# Patient Record
Sex: Male | Born: 2000 | Hispanic: No | Marital: Single | Smoking: Current every day smoker
Health system: Southern US, Community
[De-identification: ages and names within clinical notes are randomized; demographics above are authoritative.]

## PROBLEM LIST (undated history)

## (undated) DIAGNOSIS — R569 Unspecified convulsions: Secondary | ICD-10-CM

## (undated) DIAGNOSIS — N179 Acute kidney failure, unspecified: Secondary | ICD-10-CM

## (undated) DIAGNOSIS — S069X9A Unspecified intracranial injury with loss of consciousness of unspecified duration, initial encounter: Secondary | ICD-10-CM

## (undated) DIAGNOSIS — S069XAA Unspecified intracranial injury with loss of consciousness status unknown, initial encounter: Secondary | ICD-10-CM

## (undated) DIAGNOSIS — F449 Dissociative and conversion disorder, unspecified: Secondary | ICD-10-CM

## (undated) DIAGNOSIS — K5090 Crohn's disease, unspecified, without complications: Secondary | ICD-10-CM

## (undated) DIAGNOSIS — F4310 Post-traumatic stress disorder, unspecified: Secondary | ICD-10-CM

## (undated) HISTORY — DX: Crohn's disease, unspecified, without complications: K50.90

## (undated) HISTORY — DX: Acute kidney failure, unspecified: N17.9

## (undated) HISTORY — DX: Unspecified intracranial injury with loss of consciousness status unknown, initial encounter: S06.9XAA

## (undated) HISTORY — PX: COLONOSCOPY: SHX174

## (undated) HISTORY — DX: Dissociative and conversion disorder, unspecified: F44.9

## (undated) HISTORY — DX: Post-traumatic stress disorder, unspecified: F43.10

## (undated) HISTORY — PX: UPPER GI ENDOSCOPY: SHX6162

## (undated) HISTORY — DX: Unspecified intracranial injury with loss of consciousness of unspecified duration, initial encounter: S06.9X9A

## (undated) HISTORY — PX: TONSILECTOMY, ADENOIDECTOMY, BILATERAL MYRINGOTOMY AND TUBES: SHX2538

---
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
## 2020-04-23 DIAGNOSIS — S069X9A Unspecified intracranial injury with loss of consciousness of unspecified duration, initial encounter: Secondary | ICD-10-CM | POA: Insufficient documentation

## 2020-04-23 DIAGNOSIS — K50119 Crohn's disease of large intestine with unspecified complications: Secondary | ICD-10-CM | POA: Insufficient documentation

## 2020-04-23 DIAGNOSIS — F4312 Post-traumatic stress disorder, chronic: Secondary | ICD-10-CM | POA: Insufficient documentation

## 2020-04-23 DIAGNOSIS — Z87898 Personal history of other specified conditions: Secondary | ICD-10-CM | POA: Insufficient documentation

## 2020-07-21 ENCOUNTER — Ambulatory Visit: Payer: BC Managed Care – PPO | Admitting: Internal Medicine

## 2020-07-25 ENCOUNTER — Other Ambulatory Visit: Payer: Self-pay

## 2020-07-25 ENCOUNTER — Emergency Department (HOSPITAL_COMMUNITY)
Admission: EM | Admit: 2020-07-25 | Discharge: 2020-07-26 | Disposition: A | Discharge status: Home / Self Care | Payer: BC Managed Care – PPO | Attending: Emergency Medicine | Admitting: Emergency Medicine

## 2020-07-25 ENCOUNTER — Encounter (HOSPITAL_COMMUNITY): Payer: Self-pay | Admitting: Emergency Medicine

## 2020-07-25 DIAGNOSIS — M5441 Lumbago with sciatica, right side: Secondary | ICD-10-CM | POA: Insufficient documentation

## 2020-07-25 DIAGNOSIS — M545 Low back pain, unspecified: Secondary | ICD-10-CM | POA: Diagnosis present

## 2020-07-25 DIAGNOSIS — M5431 Sciatica, right side: Secondary | ICD-10-CM

## 2020-07-25 DIAGNOSIS — M5442 Lumbago with sciatica, left side: Secondary | ICD-10-CM | POA: Diagnosis not present

## 2020-07-25 DIAGNOSIS — M5432 Sciatica, left side: Secondary | ICD-10-CM

## 2020-07-25 HISTORY — DX: Unspecified convulsions: R56.9

## 2020-07-25 LAB — URINALYSIS, ROUTINE W REFLEX MICROSCOPIC
Bilirubin Urine: NEGATIVE
Glucose, UA: NEGATIVE mg/dL
Hgb urine dipstick: NEGATIVE
Ketones, ur: NEGATIVE mg/dL
Leukocytes,Ua: NEGATIVE
Nitrite: NEGATIVE
Protein, ur: NEGATIVE mg/dL
Specific Gravity, Urine: 1.027 (ref 1.005–1.030)
pH: 6 (ref 5.0–8.0)

## 2020-07-25 MED ORDER — KETOROLAC TROMETHAMINE 30 MG/ML IJ SOLN
30.0000 mg | Freq: Once | INTRAMUSCULAR | Status: AC
  Administered 2020-07-26: 30 mg via INTRAMUSCULAR
  Filled 2020-07-25: qty 1

## 2020-07-25 NOTE — ED Triage Notes (Signed)
Pt c/o left sided lower back pain that moves down his leg.  Also has some pain on the right but not as much.  Hx of seizure d/o

## 2020-07-26 ENCOUNTER — Emergency Department (HOSPITAL_COMMUNITY): Payer: BC Managed Care – PPO

## 2020-07-26 MED ORDER — NAPROXEN 500 MG PO TABS: 500.0000 mg | ORAL_TABLET | Freq: Two times a day (BID) | ORAL | 0 refills | Status: DC

## 2020-07-26 MED ORDER — CYCLOBENZAPRINE HCL 10 MG PO TABS
5.0000 mg | ORAL_TABLET | Freq: Once | ORAL | Status: AC
  Administered 2020-07-26: 5 mg via ORAL
  Filled 2020-07-26: qty 1

## 2020-07-26 NOTE — Discharge Instructions (Addendum)
You are seen today for back pain.  Some of your symptoms are consistent with sciatica.  This is usually inflammation of the disc in your spine.  Take anti-inflammatories for pain.  You may benefit from PT.

## 2020-07-26 NOTE — ED Provider Notes (Signed)
[facility] EMERGENCY DEPARTMENT Provider Note   CSN: [id number] Arrival date & time: 07/25/20  2108     History Chief Complaint  Patient presents with  . Back Pain    Gregg Gibson is a 20 y.o. male.  HPI     This a 21 year old male with a history of seizures who presents with back pain.  Patient reports 2-day history of lower back pain.  He states that it radiates into the bilateral legs.  It radiates down to the posterior knee on the right down to his foot on the left.  He reports pain that inhibits his ability to walk.  He is not had any weakness or difficulty peeing.  He has not taken anything for the pain but states that he smokes marijuana frequently.  He rates his pain at 10 out of 10.  He describes the pain is achy and "tingly."  He denies any falls or injury.  Denies heavy lifting.  Denies history of cancer or IV drug use.  Past Medical History:  Diagnosis Date  . Seizures ([place])     There are no problems to display for this patient.   History reviewed. No pertinent surgical history.     No family history on file.     Home Medications Prior to Admission medications   Medication Sig Start Date End Date Taking? Authorizing Provider  naproxen (NAPROSYN) 500 MG tablet Take 1 tablet (500 mg total) by mouth 2 (two) times daily. 07/26/20  Yes Elsi Stelzer, Barbette Hair, MD    Allergies    Patient has no allergy information on record.  Review of Systems   Review of Systems  Genitourinary: Negative for difficulty urinating and dysuria.  Musculoskeletal: Positive for back pain.  Neurological: Negative for weakness.  All other systems reviewed and are negative.   Physical Exam Updated Vital Signs BP 120/83 (BP Location: Left Arm)   Pulse 72   Temp 98.2 F (36.8 C) (Oral)   Resp 16   SpO2 98%   Physical Exam Vitals and nursing note reviewed.  Constitutional:      Appearance: He is well-developed and well-nourished. He is not ill-appearing.   HENT:     Head: Normocephalic and atraumatic.     Nose: Nose normal.     Mouth/Throat:     Mouth: Mucous membranes are moist.  Eyes:     Pupils: Pupils are equal, round, and reactive to light.  Cardiovascular:     Rate and Rhythm: Normal rate and regular rhythm.     Heart sounds: Normal heart sounds. No murmur heard.   Pulmonary:     Effort: Pulmonary effort is normal. No respiratory distress.     Breath sounds: Normal breath sounds. No wheezing.  Abdominal:     General: Bowel sounds are normal.     Palpations: Abdomen is soft.     Tenderness: There is no abdominal tenderness. There is no rebound.  Musculoskeletal:        General: No edema.     Cervical back: Neck supple.     Comments: Tenderness palpation bilateral paraspinous musculature of the lower lumbar spine, no midline tenderness palpation, step-off, deformity  Lymphadenopathy:     Cervical: No cervical adenopathy.  Skin:    General: Skin is warm and dry.  Neurological:     Mental Status: He is alert and oriented to person, place, and time.     Comments: 5 out of 5 strength bilateral lower extremities, 2+ and  equal patellar reflexes bilaterally  Psychiatric:        Mood and Affect: Mood and affect and mood normal.     ED Results / Procedures / Treatments   Labs (all labs ordered are listed, but only abnormal results are displayed) Labs Reviewed  URINALYSIS, ROUTINE W REFLEX MICROSCOPIC    EKG None  Radiology DG Lumbar Spine Complete  Result Date: 07/26/2020 CLINICAL DATA:  Back pain EXAM: LUMBAR SPINE - COMPLETE 4+ VIEW COMPARISON:  None. FINDINGS: There is no evidence of lumbar spine fracture. Alignment is normal. Intervertebral disc spaces are maintained. IMPRESSION: Negative. Electronically Signed   By: Fidela Salisbury MD   On: 07/26/2020 01:11    Procedures Procedures   Medications Ordered in ED Medications  ketorolac (TORADOL) 30 MG/ML injection 30 mg (30 mg Intramuscular Given 07/26/20 0020)   cyclobenzaprine (FLEXERIL) tablet 5 mg (5 mg Oral Given 07/26/20 0119)    ED Course  I have reviewed the triage vital signs and the nursing notes.  Pertinent labs & imaging results that were available during my care of the patient were reviewed by me and considered in my medical decision making (see chart for details).  Clinical Course as of 07/26/20 0144  Tue Jul 26, 2020  0038 Patient continuing to complain of pain following Toradol.  He reports no improvement.  Added muscle relaxant and will obtain x-rays. [CH]    Clinical Course User Index [CH] Anothy Bufano, Barbette Hair, MD   MDM Rules/Calculators/A&P                          Patient presents with low back pain.  Overall nontoxic and vital signs are reassuring.  No signs or symptoms of cauda equina.  Given radiation of pain, symptoms seem consistent with sciatica.  He has no red flags and his neurologic exam is normal.  He was initially given Toradol with no improvement.  X-ray showed no evidence of acute fracture.  Muscle relaxant was added.  He is ambulatory.  Recommend scheduled anti-inflammatories and physical therapy for presumed sciatica.  He is afebrile.  Low suspicion for spinal lesion such as epidural abscess.  After history, exam, and medical workup I feel the patient has been appropriately medically screened and is safe for discharge home. Pertinent diagnoses were discussed with the patient. Patient was given return precautions.  Final Clinical Impression(s) / ED Diagnoses Final diagnoses:  Bilateral sciatica    Rx / DC Orders ED Discharge Orders         Ordered    naproxen (NAPROSYN) 500 MG tablet  2 times daily        07/26/20 0144           Greig Altergott, Barbette Hair, MD 07/26/20 0145

## 2020-07-26 NOTE — ED Notes (Signed)
Pt taken to scan at this time.

## 2020-07-28 ENCOUNTER — Ambulatory Visit (INDEPENDENT_AMBULATORY_CARE_PROVIDER_SITE_OTHER): Payer: BC Managed Care – PPO | Admitting: Nurse Practitioner

## 2020-07-28 ENCOUNTER — Telehealth (INDEPENDENT_AMBULATORY_CARE_PROVIDER_SITE_OTHER): Payer: Self-pay | Admitting: Nurse Practitioner

## 2020-07-28 ENCOUNTER — Other Ambulatory Visit: Payer: Self-pay

## 2020-07-28 ENCOUNTER — Encounter (INDEPENDENT_AMBULATORY_CARE_PROVIDER_SITE_OTHER): Payer: Self-pay | Admitting: Nurse Practitioner

## 2020-07-28 VITALS — BP 130/80 | HR 92 | Temp 97.8°F | Resp 18 | Ht 68.0 in | Wt 144.0 lb

## 2020-07-28 DIAGNOSIS — Z131 Encounter for screening for diabetes mellitus: Secondary | ICD-10-CM

## 2020-07-28 DIAGNOSIS — M5441 Lumbago with sciatica, right side: Secondary | ICD-10-CM

## 2020-07-28 DIAGNOSIS — F4310 Post-traumatic stress disorder, unspecified: Secondary | ICD-10-CM | POA: Diagnosis not present

## 2020-07-28 DIAGNOSIS — R569 Unspecified convulsions: Secondary | ICD-10-CM | POA: Diagnosis not present

## 2020-07-28 DIAGNOSIS — G8929 Other chronic pain: Secondary | ICD-10-CM

## 2020-07-28 DIAGNOSIS — R002 Palpitations: Secondary | ICD-10-CM

## 2020-07-28 DIAGNOSIS — M5442 Lumbago with sciatica, left side: Secondary | ICD-10-CM | POA: Diagnosis not present

## 2020-07-28 DIAGNOSIS — R000 Tachycardia, unspecified: Secondary | ICD-10-CM | POA: Diagnosis not present

## 2020-07-28 DIAGNOSIS — Z8269 Family history of other diseases of the musculoskeletal system and connective tissue: Secondary | ICD-10-CM

## 2020-07-28 DIAGNOSIS — Z13220 Encounter for screening for lipoid disorders: Secondary | ICD-10-CM

## 2020-07-28 DIAGNOSIS — Z1329 Encounter for screening for other suspected endocrine disorder: Secondary | ICD-10-CM

## 2020-07-28 NOTE — Progress Notes (Signed)
Subjective:  Patient ID: Gregg Gibson, male    DOB: 2001-03-02  Age: 20 y.o. MRN: [id number]  CC:  Chief Complaint  Patient presents with  . Establish Care  . Back Pain  . 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  He recently moved to this area from our area. He is here to establish care with a new provider. His main concerns are listed above. Back pain: He has been experiencing back pain for approximately 4 years. It is intermittent and usually is located more towards his left side and will radiate down his left leg. About a week ago he started experiencing severe 10 out of 10 achy/tingly pain in his lower back with bilateral radiculopathy. At that time he went to the emergency department and work-up included x-ray which did not show any significant abnormalities of his lower back. He was given muscle relaxers which did improve his pain and was told to take naproxen as needed at home. I did recommend that patient follow-up with physical therapy but he does not recall being referred to physical therapy by the emergency department.  Seizures: He has a history of seizures and tells me that his first seizure occurred when he was 20 years of age. He tells me his last seizure was a few weeks ago. He continues on Lamictal for treatment of his seizures and he also has an service dog that he keeps with him for both seizures and for his PTSD. He is in need of establishing care with a neurologist.  Cardiac palpitations: He reports having cardiac palpitations that occur every time he bends over and stands back up. He tells me he feels like a fluttering in his chest and his apple watch will tell him that his heart rate is going up to the 180s. This is very concerning to him. He denies any significant chest pain but does have some shortness of breath during the episodes.  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 He is very concerned that he too may have this would like to be evaluated by rheumatology and endocrinology.  PTSD: He does report history of PTSD and is currently on Zoloft. He would like to be evaluated and managed by psychiatry for this.  Past Medical History:  Diagnosis Date  . PTSD (post-traumatic stress disorder)   . Seizures ([place])   . TBI (traumatic brain injury) ([place])       Family History  Problem Relation Age of Onset  . Other Sister        EDS    Social History   Social History Narrative  . Not on file   Social History   Tobacco Use  . Smoking status: Not on file  . Smokeless tobacco: Not on file  Substance Use Topics  . Alcohol use: Not on file     Current Meds  Medication Sig  . carvedilol (COREG) 25 MG tablet Take 1 tablet by mouth in the morning and at bedtime.  . dicyclomine (BENTYL) 20 MG tablet Take 20 mg by mouth 3 (three) times daily.  . hydrOXYzine (ATARAX/VISTARIL) 25 MG tablet Take by mouth.  . lamoTRIgine (LAMICTAL) 200 MG tablet Take 1 tablet by mouth in the morning and at bedtime. Extended release.  . lamoTRIgine (LAMICTAL) 200 MG tablet SMARTSIG:1 Tablet(s) By  Mouth Morning-Night  . mesalamine (APRISO) 0.375 g 24 hr capsule Take 1.5 g by mouth every morning.  . naproxen (NAPROSYN) 500 MG tablet Take 1 tablet (500 mg total) by mouth 2 (two) times daily.  Marland Kitchen omeprazole (PRILOSEC) 20 MG capsule Take 40 mg by mouth 2 (two) times daily.  . ondansetron (ZOFRAN) 4 MG tablet Take 4 mg by mouth 3 (three) times daily as needed.  . prazosin (MINIPRESS) 2 MG capsule Take 2 mg by mouth 2 (two) times daily.  . sertraline (ZOLOFT) 100 MG tablet Take 1 tablet by mouth in the morning and at bedtime.    ROS:  Review of Systems  Constitutional: Negative for fever.  Eyes: Positive for blurred vision (and nystagmus with seizures).  Respiratory: Positive for shortness  of breath.   Cardiovascular: Positive for palpitations. Negative for chest pain.  Neurological: Positive for dizziness, seizures and headaches (mostly on right side).     Objective:   Today's Vitals: BP 130/80 (BP Location: Right Arm, Patient Position: Sitting, Cuff Size: Normal)   Pulse 92   Temp 97.8 F (36.6 C) (Temporal)   Resp 18   Ht _0  (1.727 m)   Wt 144 lb (65.3 kg)   SpO2 100%   BMI 21.90 kg/m  Vitals with BMI 07/28/2020 07/26/2020 07/26/2020  Height _1  - -  Weight 144 lbs - -  BMI 37.8 - -  Systolic 588 502 774  Diastolic 80 74 83  Pulse 92 66 72     Physical Exam Vitals reviewed.  Constitutional:      Appearance: Normal appearance.  HENT:     Head: Normocephalic and atraumatic.  Cardiovascular:     Rate and Rhythm: Normal rate and regular rhythm.  Pulmonary:     Effort: Pulmonary effort is normal.     Breath sounds: Normal breath sounds.  Musculoskeletal:     Cervical back: Normal and neck supple.     Thoracic back: Normal.     Lumbar back: No bony tenderness. Positive left straight leg raise test.  Skin:    General: Skin is warm and dry.  Neurological:     Mental Status: He is alert and oriented to person, place, and time.     Sensory: Sensation is intact.     Motor: Weakness (slightly weaker to left lower extremity) present.     Gait: Gait is intact.  Psychiatric:        Mood and Affect: Mood normal.        Behavior: Behavior normal.        Thought Content: Thought content normal.        Judgment: Judgment normal.          Assessment and Plan   1. Chronic bilateral low back pain with bilateral sciatica   2. Seizures ([place])   3. PTSD (post-traumatic stress disorder)   4. Tachycardia   5. Palpitations   6. Family history of connective tissue disease   7. Thyroid disorder screen   8. Screening for diabetes mellitus   9. Screening cholesterol level      Plan: 1. I do agree with the ER physician and believe that his symptoms most  likely represent sciatica and muscle spasm. Muscle spasm seems to be much improved compared to last week. I will refer him to physical medicine rehab for further assistance with managing his back pain. 2. We will refer him to neurology to help with managing his seizures. 3. We will refer him  to psychiatry to help manage his PTSD. 4, 5. Will refer patient to cardiology for further evaluation of his palpitations. 6. 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 This is a syndrome that I am not familiar with and will look into between now and his next appointment. 7.-9. We will check blood work for further evaluation today.   Tests ordered Orders Placed This Encounter  Procedures  . CBC with Differential/Platelets  . CMP with eGFR([facility])  . Lipid Panel  . Hemoglobin A1c  . TSH  . Ambulatory referral to Physical Medicine Rehab  . Ambulatory referral to Neurology  . Ambulatory referral to Psychiatry  . Ambulatory referral to Cardiology  . Ambulatory referral to Rheumatology  . Ambulatory referral to Endocrinology      No orders of the defined types were placed in this encounter.   Patient to follow-up in 1 month or sooner as needed.  Ailene Ards, NP

## 2020-07-28 NOTE — Telephone Encounter (Addendum)
Ordering referral to neurology I prefer [facility]. Thank you.  He is also being referred for the following: 1. Cardiology for cardiac palpitations 2. Endocrinology - Evaluation of possible Ehlers Danlos Syndrome 3. Rheumatology - Evaluation of possible Ehlers Danlos Sydrome 4. Psychiatry - Management of his PTSD 5. Physical Medicine Rehab - Back pain

## 2020-07-29 ENCOUNTER — Telehealth: Payer: Self-pay

## 2020-07-29 ENCOUNTER — Encounter (INDEPENDENT_AMBULATORY_CARE_PROVIDER_SITE_OTHER): Payer: Self-pay | Admitting: Nurse Practitioner

## 2020-07-29 LAB — CBC WITH DIFFERENTIAL/PLATELET
Absolute Monocytes: 529 cells/uL (ref 200–950)
Basophils Absolute: 19 cells/uL (ref 0–200)
Basophils Relative: 0.3 %
Eosinophils Absolute: 296 cells/uL (ref 15–500)
Eosinophils Relative: 4.7 %
HCT: 44.9 % (ref 38.5–50.0)
Hemoglobin: 15.3 g/dL (ref 13.2–17.1)
Lymphs Abs: 2079 cells/uL (ref 850–3900)
MCH: 30.5 pg (ref 27.0–33.0)
MCHC: 34.1 g/dL (ref 32.0–36.0)
MCV: 89.4 fL (ref 80.0–100.0)
MPV: 12.6 fL — ABNORMAL HIGH (ref 7.5–12.5)
Monocytes Relative: 8.4 %
Neutro Abs: 3377 cells/uL (ref 1500–7800)
Neutrophils Relative %: 53.6 %
Platelets: 220 10*3/uL (ref 140–400)
RBC: 5.02 10*6/uL (ref 4.20–5.80)
RDW: 12 % (ref 11.0–15.0)
Total Lymphocyte: 33 %
WBC: 6.3 10*3/uL (ref 3.8–10.8)

## 2020-07-29 LAB — COMPLETE METABOLIC PANEL WITH GFR
AG Ratio: 2 (calc) (ref 1.0–2.5)
ALT: 14 U/L (ref 9–46)
AST: 18 U/L (ref 10–40)
Albumin: 4.9 g/dL (ref 3.6–5.1)
Alkaline phosphatase (APISO): 77 U/L (ref 36–130)
BUN: 14 mg/dL (ref 7–25)
CO2: 29 mmol/L (ref 20–32)
Calcium: 10.3 mg/dL (ref 8.6–10.3)
Chloride: 103 mmol/L (ref 98–110)
Creat: 0.89 mg/dL (ref 0.60–1.35)
GFR, Est African American: 143 mL/min/{1.73_m2} (ref >=60)
GFR, Est Non African American: 123 mL/min/{1.73_m2} (ref >=60)
Globulin: 2.5 g/dL (calc) (ref 1.9–3.7)
Glucose, Bld: 71 mg/dL (ref 65–139)
Potassium: 4.9 mmol/L (ref 3.5–5.3)
Sodium: 141 mmol/L (ref 135–146)
Total Bilirubin: 0.3 mg/dL (ref 0.2–1.2)
Total Protein: 7.4 g/dL (ref 6.1–8.1)

## 2020-07-29 LAB — HEMOGLOBIN A1C
Hgb A1c MFr Bld: 5.2 % of total Hgb (ref <5.7)
Mean Plasma Glucose: 103 mg/dL
eAG (mmol/L): 5.7 mmol/L

## 2020-07-29 LAB — LIPID PANEL
Cholesterol: 212 mg/dL — ABNORMAL HIGH (ref <200)
HDL: 33 mg/dL — ABNORMAL LOW (ref >=40)
LDL Cholesterol (Calc): 160 mg/dL (calc) — ABNORMAL HIGH
Non-HDL Cholesterol (Calc): 179 mg/dL (calc) — ABNORMAL HIGH (ref <130)
Total CHOL/HDL Ratio: 6.4 (calc) — ABNORMAL HIGH (ref <5.0)
Triglycerides: 85 mg/dL (ref <150)

## 2020-07-29 LAB — TSH: TSH: 3.48 mIU/L (ref 0.40–4.50)

## 2020-07-29 NOTE — Telephone Encounter (Signed)
Hi Sarah- I spoke with Dr Dorris Fetch about this referral we received via fax. This patient needs to be referred to Rheumatology  For [facility] Danos Syndrome. Thank you!

## 2020-07-30 NOTE — Telephone Encounter (Signed)
Thank you [place]!

## 2020-08-01 NOTE — Telephone Encounter (Signed)
You are welcome

## 2020-08-01 NOTE — Telephone Encounter (Signed)
I sent over all referrals. Waiting for appt to be set soon. TBA

## 2020-08-02 ENCOUNTER — Encounter: Payer: Self-pay | Admitting: Physical Medicine and Rehabilitation

## 2020-08-15 ENCOUNTER — Telehealth (INDEPENDENT_AMBULATORY_CARE_PROVIDER_SITE_OTHER): Payer: Self-pay

## 2020-08-15 DIAGNOSIS — F4310 Post-traumatic stress disorder, unspecified: Secondary | ICD-10-CM

## 2020-08-15 DIAGNOSIS — M5442 Lumbago with sciatica, left side: Secondary | ICD-10-CM

## 2020-08-15 DIAGNOSIS — G8929 Other chronic pain: Secondary | ICD-10-CM

## 2020-08-15 MED ORDER — NAPROXEN 500 MG PO TABS: 500.0000 mg | ORAL_TABLET | Freq: Two times a day (BID) | ORAL | 0 refills | Status: DC

## 2020-08-15 MED ORDER — HYDROXYZINE HCL 25 MG PO TABS: 25.0000 mg | ORAL_TABLET | Freq: Every day | ORAL | 1 refills | Status: DC | PRN

## 2020-08-15 NOTE — Telephone Encounter (Signed)
Gregg Gibson, can you take care of this since you saw the patient?  Thanks.

## 2020-08-15 NOTE — Telephone Encounter (Signed)
Patient called and left a detailed voice message requesting a refill for the following medications:  hydrOXYzine (ATARAX/VISTARIL) 25 MG tablet  Last filled 04/07/2020, not able to see the quantity  naproxen (NAPROSYN) 500 MG tablet  Last filled on 07/26/2020, # 30 BID with 0 refills  Last OV 07/28/2020  Next OIV 08/31/2020  [place]

## 2020-08-20 ENCOUNTER — Emergency Department (HOSPITAL_COMMUNITY)
Admission: EM | Admit: 2020-08-20 | Discharge: 2020-08-20 | Disposition: A | Discharge status: Home / Self Care | Payer: BC Managed Care – PPO | Attending: Emergency Medicine | Admitting: Emergency Medicine

## 2020-08-20 ENCOUNTER — Other Ambulatory Visit: Payer: Self-pay

## 2020-08-20 ENCOUNTER — Emergency Department (HOSPITAL_COMMUNITY): Payer: BC Managed Care – PPO

## 2020-08-20 ENCOUNTER — Encounter (HOSPITAL_COMMUNITY): Payer: Self-pay | Admitting: Emergency Medicine

## 2020-08-20 ENCOUNTER — Encounter (INDEPENDENT_AMBULATORY_CARE_PROVIDER_SITE_OTHER): Payer: Self-pay | Admitting: Nurse Practitioner

## 2020-08-20 DIAGNOSIS — R7989 Other specified abnormal findings of blood chemistry: Secondary | ICD-10-CM

## 2020-08-20 DIAGNOSIS — R7402 Elevation of levels of lactic acid dehydrogenase (LDH): Secondary | ICD-10-CM | POA: Insufficient documentation

## 2020-08-20 DIAGNOSIS — R4182 Altered mental status, unspecified: Secondary | ICD-10-CM | POA: Insufficient documentation

## 2020-08-20 LAB — COMPREHENSIVE METABOLIC PANEL
ALT: 15 U/L (ref 0–44)
AST: 21 U/L (ref 15–41)
Albumin: 4.6 g/dL (ref 3.5–5.0)
Alkaline Phosphatase: 63 U/L (ref 38–126)
Anion gap: 12 (ref 5–15)
BUN: 17 mg/dL (ref 6–20)
CO2: 25 mmol/L (ref 22–32)
Calcium: 9.7 mg/dL (ref 8.9–10.3)
Chloride: 102 mmol/L (ref 98–111)
Creatinine, Ser: 0.97 mg/dL (ref 0.61–1.24)
GFR, Estimated: >60 mL/min (ref >60)
Glucose, Bld: 135 mg/dL — ABNORMAL HIGH (ref 70–99)
Potassium: 4 mmol/L (ref 3.5–5.1)
Sodium: 139 mmol/L (ref 135–145)
Total Bilirubin: 0.9 mg/dL (ref 0.3–1.2)
Total Protein: 7.2 g/dL (ref 6.5–8.1)

## 2020-08-20 LAB — CBC WITH DIFFERENTIAL/PLATELET
Abs Immature Granulocytes: 0.04 10*3/uL (ref 0.00–0.07)
Basophils Absolute: 0 10*3/uL (ref 0.0–0.1)
Basophils Relative: 0 %
Eosinophils Absolute: 0.5 10*3/uL (ref 0.0–0.5)
Eosinophils Relative: 4 %
HCT: 39.3 % (ref 39.0–52.0)
Hemoglobin: 13.3 g/dL (ref 13.0–17.0)
Immature Granulocytes: 0 %
Lymphocytes Relative: 27 %
Lymphs Abs: 3.5 10*3/uL (ref 0.7–4.0)
MCH: 30.7 pg (ref 26.0–34.0)
MCHC: 33.8 g/dL (ref 30.0–36.0)
MCV: 90.8 fL (ref 80.0–100.0)
Monocytes Absolute: 1.1 10*3/uL — ABNORMAL HIGH (ref 0.1–1.0)
Monocytes Relative: 8 %
Neutro Abs: 8 10*3/uL — ABNORMAL HIGH (ref 1.7–7.7)
Neutrophils Relative %: 61 %
Platelets: 215 10*3/uL (ref 150–400)
RBC: 4.33 MIL/uL (ref 4.22–5.81)
RDW: 11.9 % (ref 11.5–15.5)
WBC: 13.2 10*3/uL — ABNORMAL HIGH (ref 4.0–10.5)
nRBC: 0 % (ref 0.0–0.2)

## 2020-08-20 LAB — RAPID URINE DRUG SCREEN, HOSP PERFORMED
Amphetamines: NOT DETECTED
Barbiturates: NOT DETECTED
Benzodiazepines: NOT DETECTED
Cocaine: NOT DETECTED
Opiates: NOT DETECTED
Tetrahydrocannabinol: POSITIVE — AB

## 2020-08-20 LAB — CK: Total CK: 104 U/L (ref 49–397)

## 2020-08-20 LAB — SALICYLATE LEVEL: Salicylate Lvl: <7 mg/dL — ABNORMAL LOW (ref 7.0–30.0)

## 2020-08-20 LAB — ETHANOL: Alcohol, Ethyl (B): <10 mg/dL (ref <10)

## 2020-08-20 LAB — LACTIC ACID, PLASMA
Lactic Acid, Venous: 2.7 mmol/L (ref 0.5–1.9)
Lactic Acid, Venous: 3.2 mmol/L (ref 0.5–1.9)
Lactic Acid, Venous: 1.4 mmol/L (ref 0.5–1.9)

## 2020-08-20 LAB — ACETAMINOPHEN LEVEL: Acetaminophen (Tylenol), Serum: 11 ug/mL (ref 10–30)

## 2020-08-20 MED ORDER — LACTATED RINGERS IV BOLUS
1000.0000 mL | Freq: Once | INTRAVENOUS | Status: AC
  Administered 2020-08-20: 1000 mL via INTRAVENOUS

## 2020-08-20 MED ORDER — ONDANSETRON HCL 4 MG/2ML IJ SOLN
4.0000 mg | Freq: Once | INTRAMUSCULAR | Status: AC
  Administered 2020-08-20: 4 mg via INTRAVENOUS
  Filled 2020-08-20: qty 2

## 2020-08-20 MED ORDER — SODIUM CHLORIDE 0.9 % IV BOLUS (SEPSIS)
1000.0000 mL | Freq: Once | INTRAVENOUS | Status: AC
  Administered 2020-08-20: 1000 mL via INTRAVENOUS

## 2020-08-20 NOTE — ED Notes (Signed)
Pt began yelling at this RN stating "you guys are all assholes. You all were laughing at me. I need a fucking blanket." This RN explained that I will get him a blanket after I administer his medication. Pt rolled over and yelled "I'm done talking to you, I want a blanket." Blanket provided to patient. Will continue to monitor.

## 2020-08-20 NOTE — ED Provider Notes (Signed)
Patient with altered mental status which has improved.  He is awake alert and oriented x4.  Patient has been hydrated significantly and his lactic acid is back to normal at 1.4 he does have urine drug screen is positive for marijuana.  He will follow up with his primary care doctor next week   Milton Ferguson, MD 08/20/20 1238

## 2020-08-20 NOTE — Discharge Instructions (Addendum)
Follow-up with your family doctor next week for recheck.  Return if any problem

## 2020-08-20 NOTE — ED Notes (Signed)
Date and time results received: 08/20/20 9244  Test: Lactic Acid Critical Value: 2.7  Name of Provider Notified: Dr. Roxanne Mins  Orders Received? Or Actions Taken?: EDP notified. See orders.

## 2020-08-20 NOTE — ED Triage Notes (Signed)
Pt brought in by [facility] after being called out for unresponsive person. Pt minimally responsive on arrival. Pt actively vomiting during triage. Pt told roommate prior that he thought he was going to have a seizure.

## 2020-08-20 NOTE — ED Notes (Signed)
Mother teresa updated

## 2020-08-20 NOTE — ED Provider Notes (Signed)
[facility] EMERGENCY DEPARTMENT Provider Note   CSN: [id number] Arrival date & time: 08/20/20  7672   History Chief complaint: Possible seizure  Gregg Gibson is a 20 y.o. male.  The history is provided by the EMS personnel. The history is limited by the condition of the patient (Altered mental status).  He has history of traumatic brain injury, posttraumatic stress disorder, conversion disorder, nonepileptic seizures and is brought in by ambulance because of altered mentation.  He apparently told his roommate that he felt like a seizure was coming on and he took some medication.  EMS found the patient unresponsive although he would intermittently wake up and have purposeful movement and activity.  EMS found bottles of ondansetron, hydroxyzine, and mesalamine.  None of the bottles appear to have had significant number of pills missing.  Past Medical History:  Diagnosis Date  . Conversion disorder   . PTSD (post-traumatic stress disorder)   . Seizures ([place])   . TBI (traumatic brain injury) ([place])     There are no problems to display for this patient.   No past surgical history on file.     Family History  Problem Relation Age of Onset  . Other Sister        EDS       Home Medications Prior to Admission medications   Medication Sig Start Date End Date Taking? Authorizing Provider  carvedilol (COREG) 25 MG tablet Take 1 tablet by mouth in the morning and at bedtime. 10/01/19   [provider]  dicyclomine (BENTYL) 20 MG tablet Take 20 mg by mouth 3 (three) times daily. 07/23/20   [provider]  hydrOXYzine (ATARAX/VISTARIL) 25 MG tablet Take 1 tablet (25 mg total) by mouth daily as needed. 08/15/20   Ailene Ards, NP  lamoTRIgine (LAMICTAL) 200 MG tablet Take 1 tablet by mouth in the morning and at bedtime. Extended release.    [provider]  lamoTRIgine (LAMICTAL) 200 MG tablet SMARTSIG:1 Tablet(s) By Mouth Morning-Night 07/03/20   [provider]  mesalamine (APRISO) 0.375 g 24 hr capsule Take 1.5 g by mouth every morning. 06/25/20   [provider]  naproxen (NAPROSYN) 500 MG tablet Take 1 tablet (500 mg total) by mouth 2 (two) times daily. 08/15/20   Ailene Ards, NP  omeprazole (PRILOSEC) 20 MG capsule Take 40 mg by mouth 2 (two) times daily. 05/23/20   [provider]  ondansetron (ZOFRAN) 4 MG tablet Take 4 mg by mouth 3 (three) times daily as needed. 07/15/20   [provider]  prazosin (MINIPRESS) 2 MG capsule Take 2 mg by mouth 2 (two) times daily. 07/26/20   [provider]  sertraline (ZOLOFT) 100 MG tablet Take 1 tablet by mouth in the morning and at bedtime. 05/01/20   [provider]    Allergies    Oseltamivir  Review of Systems   Review of Systems  Unable to perform ROS: Mental status change    Physical Exam Updated Vital Signs BP 127/88 (BP Location: Left Arm)   Pulse 77   Temp 97.7 F (36.5 C) (Oral)   Resp 18   Ht 5' 8"  (1.727 m)   Wt 63.9 kg   SpO2 95%   BMI 21.42 kg/m   Physical Exam Vitals and nursing note reviewed.   20 year old male, resting comfortably and in no acute distress. Vital signs are normal. Oxygen saturation is 95%, which is normal. Head is normocephalic and atraumatic. PERRLA.  Oropharynx is clear. Neck is nontender and supple without adenopathy or JVD. Back is nontender and there is no CVA tenderness. Lungs are clear without rales, wheezes, or rhonchi. Chest is nontender. Heart has regular rate and rhythm without murmur. Abdomen is soft, flat, nontender without masses or hepatosplenomegaly and peristalsis is hypoactive. Extremities have no cyanosis or edema, full range of motion is present. Skin is warm and dry without rash. Neurologic: Alternating between unresponsive and opening his eyes and seeming to react to things around him.  He is never able to follow commands.  He does move all extremities equally.  ED Results /  Procedures / Treatments   Labs (all labs ordered are listed, but only abnormal results are displayed) Labs Reviewed  COMPREHENSIVE METABOLIC PANEL  ETHANOL  CBC WITH DIFFERENTIAL/PLATELET  RAPID URINE DRUG SCREEN, HOSP PERFORMED  LACTIC ACID, PLASMA  CK    EKG EKG Interpretation  Date/Time:  Saturday August 20 2020 05:20:44 EDT Ventricular Rate:  82 PR Interval:    QRS Duration: 99 QT Interval:  369 QTC Calculation: 431 R Axis:   129 Text Interpretation: Sinus rhythm Probable right ventricular hypertrophy Borderline ST elevation, lateral leads No old tracing to compare Confirmed by Delora Fuel ([id number]) on 08/20/2020 5:30:40 AM   Radiology No results found.  Procedures Procedures  CRITICAL CARE Performed by: Delora Fuel Total critical care time: 45 minutes Critical care time was exclusive of separately billable procedures and treating other patients. Critical care was necessary to treat or prevent imminent or life-threatening deterioration. Critical care was time spent personally by me on the following activities: development of treatment plan with patient and/or surrogate as well as nursing, discussions with consultants, evaluation of patient's response to treatment, examination of patient, obtaining history from patient or surrogate, ordering and performing treatments and interventions, ordering and review of laboratory studies, ordering and review of radiographic studies, pulse oximetry and re-evaluation of patient's condition.  Medications Ordered in ED Medications  ondansetron (ZOFRAN) injection 4 mg (has no administration in time range)    ED Course  I have reviewed the triage vital signs and the nursing notes.  Pertinent labs & imaging results that were available during my care of the patient were reviewed by me and considered in my medical decision making (see chart for details).  MDM Rules/Calculators/A&P Altered mental status of uncertain cause.  Old records are  reviewed, and he had been evaluated for seizures with continuous EEG monitoring and no epileptic activity was noted in spite of several seizure-like episodes.  Tonight, patient is alternately unresponsive then briefly awake and alert but will not follow commands.  Will check screening labs including ethanol level and urine drug screen and sent for CT of head.  Patient was vomiting shortly after arrival and is given a dose of IV ondansetron.  CT of head is unremarkable.  Labs are unremarkable except for lactic acid level of 2.7.  This is nonspecific and not felt to be secondary to sepsis.  With normal CK, doubt recent seizure.  He will be given IV fluids and lactic acid level repeated.  Patient has been intermittently responsive.  Case is signed out to Dr. Roderic Palau to evaluate repeat lactic acid level and make sure patient's mental status is returning to baseline.  Final Clinical Impression(s) / ED Diagnoses Final diagnoses:  Altered mental status, unspecified altered mental status type  Elevated lactic acid level    Rx / DC Orders ED Discharge Orders    None  Delora Fuel, MD 89/48/34 [phone]
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
## 2020-08-20 NOTE — ED Notes (Signed)
Pt transported to CT ?
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
## 2020-08-20 NOTE — ED Notes (Signed)
Pt's mother updated at this time.

## 2020-08-22 ENCOUNTER — Telehealth: Payer: Self-pay | Admitting: *Deleted

## 2020-08-22 NOTE — Telephone Encounter (Signed)
Transition Care Management Unsuccessful Follow-up Telephone Call  Date of discharge and from where:  08/20/2020 - [facility]  Attempts:  1st Attempt  Reason for unsuccessful TCM follow-up call:  Left voice message

## 2020-08-23 NOTE — Telephone Encounter (Signed)
Transition Care Management Follow-up Telephone Call  Date of discharge and from where: 08/20/2020 - [facility]  How have you been since you were released from the hospital? "Fine, I guess"  Any questions or concerns? No  Items Reviewed:  Did the pt receive and understand the discharge instructions provided? Yes   Medications obtained and verified? Yes   Other? No   Any new allergies since your discharge? No   Dietary orders reviewed? No  Do you have support at home? Yes    Functional Questionnaire: (I = Independent and D = Dependent) ADLs: I  Bathing/Dressing- I  Meal Prep- I  Eating- I  Maintaining continence- I  Transferring/Ambulation- I  Managing Meds- I  Follow up appointments reviewed:   PCP Hospital f/u appt confirmed? Yes  Scheduled to see Jeralyn Ruths, NP on 08/31/2020 @ 1430.  [facility] f/u appt confirmed? Yes  Scheduled to see Neurology on 10/04/2020 @ 1500.  Are transportation arrangements needed? No   If their condition worsens, is the pt aware to call PCP or go to the Emergency Dept.? Yes  Was the patient provided with contact information for the PCP's office or ED? Yes  Was to pt encouraged to call back with questions or concerns? Yes

## 2020-08-30 ENCOUNTER — Telehealth (INDEPENDENT_AMBULATORY_CARE_PROVIDER_SITE_OTHER): Payer: Self-pay

## 2020-08-31 ENCOUNTER — Other Ambulatory Visit (INDEPENDENT_AMBULATORY_CARE_PROVIDER_SITE_OTHER): Payer: Self-pay | Admitting: Nurse Practitioner

## 2020-08-31 ENCOUNTER — Ambulatory Visit (INDEPENDENT_AMBULATORY_CARE_PROVIDER_SITE_OTHER): Payer: BC Managed Care – PPO | Admitting: Nurse Practitioner

## 2020-08-31 ENCOUNTER — Telehealth (INDEPENDENT_AMBULATORY_CARE_PROVIDER_SITE_OTHER): Payer: Self-pay | Admitting: Nurse Practitioner

## 2020-08-31 ENCOUNTER — Encounter (INDEPENDENT_AMBULATORY_CARE_PROVIDER_SITE_OTHER): Payer: Self-pay | Admitting: Nurse Practitioner

## 2020-08-31 ENCOUNTER — Other Ambulatory Visit: Payer: Self-pay

## 2020-08-31 VITALS — BP 120/80 | HR 89 | Temp 98.2°F | Ht 68.0 in | Wt 130.0 lb

## 2020-08-31 DIAGNOSIS — R292 Abnormal reflex: Secondary | ICD-10-CM | POA: Diagnosis not present

## 2020-08-31 DIAGNOSIS — D72829 Elevated white blood cell count, unspecified: Secondary | ICD-10-CM

## 2020-08-31 DIAGNOSIS — R3911 Hesitancy of micturition: Secondary | ICD-10-CM | POA: Diagnosis not present

## 2020-08-31 DIAGNOSIS — G8929 Other chronic pain: Secondary | ICD-10-CM

## 2020-08-31 LAB — CBC WITH DIFFERENTIAL/PLATELET
Absolute Monocytes: 497 cells/uL (ref 200–950)
Basophils Absolute: 41 cells/uL (ref 0–200)
Basophils Relative: 0.6 %
Eosinophils Absolute: 242 cells/uL (ref 15–500)
Eosinophils Relative: 3.5 %
HCT: 36.2 % — ABNORMAL LOW (ref 38.5–50.0)
Hemoglobin: 12.6 g/dL — ABNORMAL LOW (ref 13.2–17.1)
Lymphs Abs: 2318 cells/uL (ref 850–3900)
MCH: 31 pg (ref 27.0–33.0)
MCHC: 34.8 g/dL (ref 32.0–36.0)
MCV: 89.2 fL (ref 80.0–100.0)
MPV: 12.8 fL — ABNORMAL HIGH (ref 7.5–12.5)
Monocytes Relative: 7.2 %
Neutro Abs: 3802 cells/uL (ref 1500–7800)
Neutrophils Relative %: 55.1 %
Platelets: 240 10*3/uL (ref 140–400)
RBC: 4.06 10*6/uL — ABNORMAL LOW (ref 4.20–5.80)
RDW: 12.5 % (ref 11.0–15.0)
Total Lymphocyte: 33.6 %
WBC: 6.9 10*3/uL (ref 3.8–10.8)

## 2020-08-31 MED ORDER — NAPROXEN 500 MG PO TABS: 500.0000 mg | ORAL_TABLET | Freq: Two times a day (BID) | ORAL | 2 refills | Status: DC
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
## 2020-08-31 NOTE — Telephone Encounter (Signed)
MRI of lumbar spine c/o contrast ordered today. Please work on getting this approved/scheduled. Thank you.

## 2020-09-01 ENCOUNTER — Other Ambulatory Visit (INDEPENDENT_AMBULATORY_CARE_PROVIDER_SITE_OTHER): Payer: Self-pay | Admitting: Nurse Practitioner

## 2020-09-01 ENCOUNTER — Encounter (INDEPENDENT_AMBULATORY_CARE_PROVIDER_SITE_OTHER): Payer: Self-pay | Admitting: Nurse Practitioner

## 2020-09-01 ENCOUNTER — Telehealth (INDEPENDENT_AMBULATORY_CARE_PROVIDER_SITE_OTHER): Payer: Self-pay

## 2020-09-01 DIAGNOSIS — F4310 Post-traumatic stress disorder, unspecified: Secondary | ICD-10-CM

## 2020-09-01 DIAGNOSIS — D509 Iron deficiency anemia, unspecified: Secondary | ICD-10-CM

## 2020-09-01 MED ORDER — SERTRALINE HCL 100 MG PO TABS: 200.0000 mg | ORAL_TABLET | Freq: Every day | ORAL | 2 refills | Status: DC

## 2020-09-01 NOTE — Telephone Encounter (Signed)
AIM PRIOR AUTH GIVEN FOR MRI LUMBAR SPINE W/O CONTRAST MBOB:[id number]; VALID 09/01/20-02/27/21.

## 2020-09-01 NOTE — Telephone Encounter (Signed)
Were you able to notify him about his responsibility that his insurance company discussed with you to make sure it can be scheduled?

## 2020-09-02 ENCOUNTER — Encounter (INDEPENDENT_AMBULATORY_CARE_PROVIDER_SITE_OTHER): Payer: Self-pay | Admitting: Nurse Practitioner

## 2020-09-02 ENCOUNTER — Encounter (INDEPENDENT_AMBULATORY_CARE_PROVIDER_SITE_OTHER): Payer: Self-pay | Admitting: Internal Medicine

## 2020-09-05 ENCOUNTER — Telehealth (INDEPENDENT_AMBULATORY_CARE_PROVIDER_SITE_OTHER): Payer: Self-pay

## 2020-09-05 ENCOUNTER — Encounter (INDEPENDENT_AMBULATORY_CARE_PROVIDER_SITE_OTHER): Payer: Self-pay | Admitting: Nurse Practitioner

## 2020-09-05 ENCOUNTER — Other Ambulatory Visit (INDEPENDENT_AMBULATORY_CARE_PROVIDER_SITE_OTHER): Payer: BC Managed Care – PPO

## 2020-09-05 DIAGNOSIS — K5090 Crohn's disease, unspecified, without complications: Secondary | ICD-10-CM | POA: Diagnosis not present

## 2020-09-05 DIAGNOSIS — R1084 Generalized abdominal pain: Secondary | ICD-10-CM | POA: Diagnosis not present

## 2020-09-05 DIAGNOSIS — R197 Diarrhea, unspecified: Secondary | ICD-10-CM | POA: Diagnosis not present

## 2020-09-05 NOTE — Telephone Encounter (Signed)
He is approved; appt is set and ready for [employer].

## 2020-09-05 NOTE — Progress Notes (Signed)
Office Visit Note  Patient: Gregg Gibson             Date of Birth: 08-01-2000           MRN: [id number]             PCP: Ailene Ards, NP Referring: Ailene Ards, NP Visit Date: 09/06/2020   Subjective:  New Patient (Initial Visit) (Patient complains of patella instability. Patient complains of excessive "popping" of joints. Patient complains of left leg (from buttock to foot) pain and numbness. Patient has a lumber MRI scan scheduled on 09/12/2020. Patient also complains of upper back and left shoulder pain. )   History of Present Illness: Gregg Gibson is a 20 y.o. male with history of TBI and seizure disorder here for evaluation of chronic back pain also concern for connective tissue disease with family history of ehlers-danlos syndrome. He has longstanding joint pains and history of popping of joints with regular movements. He has back and shoulder pain but more problematic is low back pain with intermittent radiation down the left leg. He has upcoming MRI scan next week for assessment of this lumbar disease. He also reports knee cap instability that causes pain. Constipation and diarrhea symptoms he had evaluation EGD and colonoscopy that was negative for microscopic colitis or IBD findings. He experiences vertigo symptoms sometimes and has had seizure. Does not have frequent heart palpitations with postural changes. No history of retinal or lens dislocation. Previous TTE has indicated some dilated or hypertrophic cardiac changes but no aortic root dilation or aneurysm. His biological sister was diagnosed with ehlers danlos syndrome and he has concern related to this or associated risks.  Activities of Daily Living:  Patient reports morning stiffness for 1-2 hours.   Patient Reports nocturnal pain.  Difficulty dressing/grooming: Reports Difficulty climbing stairs: Reports Difficulty getting out of chair: Reports Difficulty using hands for taps, buttons, cutlery, and/or writing:  Reports  Review of Systems  Constitutional: Positive for fatigue.  HENT: Positive for mouth sores, mouth dryness and nose dryness.   Eyes: Positive for pain, visual disturbance and dryness. Negative for itching.  Respiratory: Positive for shortness of breath. Negative for cough, hemoptysis and difficulty breathing.   Cardiovascular: Positive for chest pain, palpitations and swelling in legs/feet.  Gastrointestinal: Positive for abdominal pain, constipation and diarrhea. Negative for blood in stool.  Endocrine: Negative for increased urination.  Genitourinary: Negative for painful urination.  Musculoskeletal: Positive for arthralgias, joint pain, myalgias, muscle weakness, morning stiffness, muscle tenderness and myalgias. Negative for joint swelling.  Skin: Negative for color change, rash and redness.  Allergic/Immunologic: Negative for susceptible to infections.  Neurological: Positive for dizziness, numbness, headaches, memory loss and weakness.  Hematological: Positive for bruising/bleeding tendency and swollen glands.  Psychiatric/Behavioral: Positive for confusion and sleep disturbance.    PMFS History:  Patient Active Problem List   Diagnosis Date Noted  . Polyarthralgia 09/09/2020  . Sciatica 09/06/2020  . Crohn's disease of colon with complication ([place]) 36/46/8032  . Chronic post-traumatic stress disorder (PTSD) 04/23/2020  . History of seizure 04/23/2020  . Traumatic brain injury with loss of consciousness ([place]) 04/23/2020  . Essential (primary) hypertension 10/31/2019  . Elevated TSH 10/20/2019  . Simple febrile convulsions ([place]) 10/20/2019  . Abdominal pain, acute, generalized 03/21/2017    Past Medical History:  Diagnosis Date  . Acute kidney injury ([place])   . Conversion disorder   . Crohn's disease ([place])   . PTSD (post-traumatic stress disorder)   .  Seizures ([place])   . TBI (traumatic brain injury) ([place])     Family History  Problem Relation Age of Onset  . Other  Sister        EDS  . Ehlers-Danlos syndrome Sister   . Scoliosis Mother   . Scoliosis Maternal Grandmother    Past Surgical History:  Procedure Laterality Date  . COLONOSCOPY     Has had 3 done  . TONSILECTOMY, ADENOIDECTOMY, BILATERAL MYRINGOTOMY AND TUBES    . UPPER GI ENDOSCOPY     Has had 2 of these done   Social History   Social History Narrative  . Not on file   Immunization History  Administered Date(s) Administered  . HPV 9-valent 01/19/2015, 04/11/2015, 08/10/2015  . Hepatitis A 01/19/2015, 08/10/2015  . Influenza Split 03/17/2012  . Influenza,inj,Quad PF,6-35 Mos 03/26/2016  . Influenza,inj,quad, With Preservative 03/03/2013, 03/22/2014, 04/11/2015  . Meningococcal Conjugate 01/19/2015, 12/24/2016  . PFIZER(Purple Top)SARS-COV-2 Vaccination 06/28/2020, 07/19/2020  . Td 04/30/2020  . Tdap 01/09/2012     Objective: Vital Signs: BP 138/88 (BP Location: Left Arm, Patient Position: Sitting, Cuff Size: Normal)   Pulse 80   Ht 5' 5.75" (1.67 m)   Wt 138 lb (62.6 kg)   BMI 22.44 kg/m    Physical Exam HENT:     Right Ear: External ear normal.     Left Ear: External ear normal.     Mouth/Throat:     Mouth: Mucous membranes are moist.     Pharynx: Oropharynx is clear.  Eyes:     Conjunctiva/sclera: Conjunctivae normal.  Cardiovascular:     Rate and Rhythm: Normal rate and regular rhythm.  Pulmonary:     Effort: Pulmonary effort is normal.     Breath sounds: Normal breath sounds.  Skin:    General: Skin is warm and dry.     Findings: No rash.  Neurological:     General: No focal deficit present.     Mental Status: He is alert.  Psychiatric:        Mood and Affect: Mood normal.     Musculoskeletal Exam:  Shoulders full ROM no tenderness or swelling Elbows full ROM no tenderness or swelling Wrists full ROM no tenderness or swelling Fingers full ROM no tenderness or swelling Low back paraspinal muscle tenderness to pressure Knees full ROM slight  hyperextensibility, positive patellar grind test no crepitus Ankles full ROM no tenderness or swelling   Investigation: No additional findings.  Imaging: CT Head Wo Contrast  Result Date: 08/20/2020 CLINICAL DATA:  Unresponsive, vomiting EXAM: CT HEAD WITHOUT CONTRAST TECHNIQUE: Contiguous axial images were obtained from the base of the skull through the vertex without intravenous contrast. COMPARISON:  None. FINDINGS: Brain: No evidence of acute infarction, hemorrhage, hydrocephalus, extra-axial collection or mass lesion/mass effect. Vascular: No hyperdense vessel or unexpected calcification. Skull: Normal. Negative for fracture or focal lesion. Sinuses/Orbits: The visualized paranasal sinuses are essentially clear. The mastoid air cells are unopacified. Other: None. IMPRESSION: Normal head CT. Electronically Signed   By: Julian Hy M.D.   On: 08/20/2020 06:18    Recent Labs: Lab Results  Component Value Date   WBC 7.3 09/05/2020   HGB 11.7 (L) 09/05/2020   PLT 213 09/05/2020   NA 139 08/20/2020   K 4.0 08/20/2020   CL 102 08/20/2020   CO2 25 08/20/2020   GLUCOSE 135 (H) 08/20/2020   BUN 17 08/20/2020   CREATININE 0.97 08/20/2020   BILITOT 0.9 08/20/2020   ALKPHOS 63 08/20/2020  AST 21 08/20/2020   ALT 15 08/20/2020   PROT 7.2 08/20/2020   ALBUMIN 4.6 08/20/2020   CALCIUM 9.7 08/20/2020   GFRAA 143 07/28/2020    Speciality Comments: No specialty comments available.  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 He does not have many major joint injuries in history or surgeries. No serious complications of clinically significant orthostatic hypotension, aneurysms, or lens disclocations. No arachnodactyly on exam and scars do not appear particularly deficient or hypertrophic. Discussed genetic testing is possible for EDS  although no additional treatment would be started based on this so is not strongly needed from a clinical standpoint. Discussed resistance training/strengthening exercises for involved areas  Crohn's disease of colon with complication ([place])  Diarrhea symptoms but colonoscopy was negative for disease changes. I do not suspect his lumbar spine disease IBD associated enteropathy with no evidence at this time, MRI result upcoming is of interest.  Orders: No orders of the defined types were placed in this encounter.  No orders of the defined types were placed in this encounter.   Follow-Up Instructions: No follow-ups on file.   Collier Salina, MD  Note - This record has been created using [employer].  Chart creation errors have been sought, but may not always  have been located. Such creation errors do not reflect on  the standard of medical care.

## 2020-09-06 ENCOUNTER — Encounter: Payer: Self-pay | Admitting: Internal Medicine

## 2020-09-06 ENCOUNTER — Ambulatory Visit (INDEPENDENT_AMBULATORY_CARE_PROVIDER_SITE_OTHER): Payer: BC Managed Care – PPO | Admitting: Internal Medicine

## 2020-09-06 ENCOUNTER — Other Ambulatory Visit: Payer: Self-pay

## 2020-09-06 VITALS — BP 138/88 | HR 80 | Ht 65.75 in | Wt 138.0 lb

## 2020-09-06 DIAGNOSIS — M5430 Sciatica, unspecified side: Secondary | ICD-10-CM | POA: Insufficient documentation

## 2020-09-06 DIAGNOSIS — K50119 Crohn's disease of large intestine with unspecified complications: Secondary | ICD-10-CM | POA: Diagnosis not present

## 2020-09-06 DIAGNOSIS — M2550 Pain in unspecified joint: Secondary | ICD-10-CM | POA: Diagnosis not present

## 2020-09-06 LAB — CBC
HCT: 34.5 % — ABNORMAL LOW (ref 38.5–50.0)
Hemoglobin: 11.7 g/dL — ABNORMAL LOW (ref 13.2–17.1)
MCH: 30.4 pg (ref 27.0–33.0)
MCHC: 33.9 g/dL (ref 32.0–36.0)
MCV: 89.6 fL (ref 80.0–100.0)
MPV: 13 fL — ABNORMAL HIGH (ref 7.5–12.5)
Platelets: 213 10*3/uL (ref 140–400)
RBC: 3.85 10*6/uL — ABNORMAL LOW (ref 4.20–5.80)
RDW: 13 % (ref 11.0–15.0)
WBC: 7.3 10*3/uL (ref 3.8–10.8)

## 2020-09-06 LAB — FERRITIN: Ferritin: 72 ng/mL (ref 38–380)

## 2020-09-06 LAB — IRON: Iron: 84 ug/dL (ref 50–195)

## 2020-09-06 LAB — TRANSFERRIN: Transferrin: 258 mg/dL (ref 188–341)

## 2020-09-06 NOTE — Patient Instructions (Addendum)
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 Physical therapy with resistance training and as needed NSAIDs are recommended for this. Joint laxity tends to improve over time for most adults so it is not a progressive disease.  I think the lumbar spine MRI will be helpful in finding cause of back pain issue and what sounds like radiculopathy down the left leg that may be related.  EDS Genetic testing can be performed although there are no approved medications to treat this disease symptoms are generally managed supportively. Some patients are elligible for testing partially covered under insurance with [employer] while others can request testing personally from sites such as [employer] or others. You can check if ordering it through insurance for a specific testing company would be helpful versus self pay or assistance programs.

## 2020-09-07 ENCOUNTER — Other Ambulatory Visit (INDEPENDENT_AMBULATORY_CARE_PROVIDER_SITE_OTHER): Payer: Self-pay | Admitting: Nurse Practitioner

## 2020-09-07 DIAGNOSIS — G8929 Other chronic pain: Secondary | ICD-10-CM

## 2020-09-07 DIAGNOSIS — D649 Anemia, unspecified: Secondary | ICD-10-CM

## 2020-09-07 MED ORDER — NAPROXEN 500 MG PO TABS: 500.0000 mg | ORAL_TABLET | Freq: Two times a day (BID) | ORAL | 2 refills | Status: DC

## 2020-09-09 DIAGNOSIS — M2550 Pain in unspecified joint: Secondary | ICD-10-CM | POA: Insufficient documentation

## 2020-09-12 ENCOUNTER — Other Ambulatory Visit (INDEPENDENT_AMBULATORY_CARE_PROVIDER_SITE_OTHER): Payer: Self-pay | Admitting: Nurse Practitioner

## 2020-09-12 ENCOUNTER — Ambulatory Visit (HOSPITAL_COMMUNITY)
Admission: RE | Admit: 2020-09-12 | Discharge: 2020-09-12 | Disposition: A | Discharge status: Home / Self Care | Payer: BC Managed Care – PPO | Source: Ambulatory Visit | Attending: Nurse Practitioner | Admitting: Nurse Practitioner

## 2020-09-12 DIAGNOSIS — R3911 Hesitancy of micturition: Secondary | ICD-10-CM | POA: Diagnosis not present

## 2020-09-12 DIAGNOSIS — R292 Abnormal reflex: Secondary | ICD-10-CM | POA: Insufficient documentation

## 2020-09-12 DIAGNOSIS — M4808 Spinal stenosis, sacral and sacrococcygeal region: Secondary | ICD-10-CM

## 2020-09-12 DIAGNOSIS — M545 Low back pain, unspecified: Secondary | ICD-10-CM | POA: Diagnosis not present

## 2020-09-13 ENCOUNTER — Encounter (INDEPENDENT_AMBULATORY_CARE_PROVIDER_SITE_OTHER): Payer: Self-pay | Admitting: Nurse Practitioner

## 2020-09-13 ENCOUNTER — Other Ambulatory Visit (INDEPENDENT_AMBULATORY_CARE_PROVIDER_SITE_OTHER): Payer: Self-pay | Admitting: Nurse Practitioner

## 2020-09-13 DIAGNOSIS — R110 Nausea: Secondary | ICD-10-CM

## 2020-09-13 MED ORDER — ONDANSETRON HCL 4 MG PO TABS
4.0000 mg | ORAL_TABLET | Freq: Three times a day (TID) | ORAL | 3 refills | Status: DC | PRN
Start: 2020-09-13 — End: 2020-11-07

## 2020-09-14 ENCOUNTER — Encounter
Payer: BC Managed Care – PPO | Attending: Physical Medicine and Rehabilitation | Admitting: Physical Medicine and Rehabilitation

## 2020-09-15 DIAGNOSIS — G40209 Localization-related (focal) (partial) symptomatic epilepsy and epileptic syndromes with complex partial seizures, not intractable, without status epilepticus: Secondary | ICD-10-CM | POA: Diagnosis not present

## 2020-09-15 DIAGNOSIS — F445 Conversion disorder with seizures or convulsions: Secondary | ICD-10-CM | POA: Diagnosis not present

## 2020-09-15 DIAGNOSIS — F4310 Post-traumatic stress disorder, unspecified: Secondary | ICD-10-CM | POA: Diagnosis not present

## 2020-09-21 ENCOUNTER — Other Ambulatory Visit: Payer: Self-pay

## 2020-09-21 ENCOUNTER — Ambulatory Visit (INDEPENDENT_AMBULATORY_CARE_PROVIDER_SITE_OTHER): Payer: BC Managed Care – PPO | Admitting: Nurse Practitioner

## 2020-09-21 ENCOUNTER — Encounter (INDEPENDENT_AMBULATORY_CARE_PROVIDER_SITE_OTHER): Payer: Self-pay | Admitting: Nurse Practitioner

## 2020-09-21 ENCOUNTER — Telehealth (INDEPENDENT_AMBULATORY_CARE_PROVIDER_SITE_OTHER): Payer: Self-pay | Admitting: Nurse Practitioner

## 2020-09-21 VITALS — BP 116/70 | HR 78 | Temp 97.9°F | Ht 65.75 in | Wt 136.4 lb

## 2020-09-21 DIAGNOSIS — R002 Palpitations: Secondary | ICD-10-CM | POA: Diagnosis not present

## 2020-09-21 DIAGNOSIS — R55 Syncope and collapse: Secondary | ICD-10-CM | POA: Diagnosis not present

## 2020-09-21 DIAGNOSIS — I10 Essential (primary) hypertension: Secondary | ICD-10-CM | POA: Diagnosis not present

## 2020-09-21 DIAGNOSIS — D649 Anemia, unspecified: Secondary | ICD-10-CM | POA: Diagnosis not present

## 2020-09-21 DIAGNOSIS — M4808 Spinal stenosis, sacral and sacrococcygeal region: Secondary | ICD-10-CM

## 2020-09-21 NOTE — Telephone Encounter (Signed)
Cardiac echocardiogram ordered today for further evaluation of near syncopal episode.  This can be done at [facility].

## 2020-09-21 NOTE — Progress Notes (Signed)
Subjective:  Patient ID: Gregg Gibson, male    DOB: 03/24/2001  Age: 20 y.o. MRN: [id number]  CC:  Chief Complaint  Patient presents with  . Follow-up    Doing okay, when patient bends over and passed out and not sure why that happens  . Other    Near syncope  . Anemia  . Back Pain      HPI  This patient arrives today for the above.  Near syncope: Upon rooming he mentioned to the medical assistant that he has been passing out when he changes positions.  Upon further questioning he tells me does not actually pass out but he gets very dizzy and feels close to passing out.  This is happened a total of 3 times over the last few weeks.  He tells me its mostly when he is either squatting or sitting and then goes to stand up.  He does sometimes feel cardiac palpitations and tells me he does have elevated blood pressure when he does not take his carvedilol.  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  Apparently it was surgically removed.  Anemia: He also was found to have normocytic anemia on blood work was checked last time.  He does not know of a history of thalassemia.  He was referred to hematology for evaluation, but we could consider checking hemoglobin electrophoresis today for further evaluation.  Back pain: He also has been experiencing back pain with radiculopathy symptoms.  He did undergo MRI which did show some spinal stenosis at S1.  He has been referred to neurosurgery, he tells me he thinks he received a voicemail from the neurosurgeons office today he has yet to call them back to schedule an appointment.  Past Medical History:  Diagnosis Date  . Acute kidney injury ([place])   . Conversion disorder   . Crohn's disease ([place])   . PTSD (post-traumatic stress disorder)   . Seizures ([place])   . TBI (traumatic brain injury) ([place])       Family History  Problem Relation Age of Onset  . Other Sister         EDS  . Ehlers-Danlos syndrome Sister   . Scoliosis Mother   . Scoliosis Maternal Grandmother     Social History   Social History Narrative  . Not on file   Social History   Tobacco Use  . Smoking status: Current Every Day Smoker    Packs/day: 0.25    Types: Cigarettes  . Smokeless tobacco: Never Used  Substance Use Topics  . Alcohol use: Never     Current Meds  Medication Sig  . albuterol (VENTOLIN HFA) 108 (90 Base) MCG/ACT inhaler Inhale 1-2 puffs into the lungs every 6 (six) hours as needed for wheezing or shortness of breath.  . carvedilol (COREG) 25 MG tablet Take 1 tablet by mouth in the morning and at bedtime.  . hydrOXYzine (ATARAX/VISTARIL) 25 MG tablet Take 1 tablet (25 mg total) by mouth daily as needed.  . hyoscyamine (LEVBID) 0.375 MG 12 hr tablet Take 0.375 mg by mouth 2 (two) times daily.  Marland Kitchen lamoTRIgine (LAMICTAL) 200 MG tablet Take 1 tablet by mouth in the morning and at bedtime. Extended release.  . naproxen (NAPROSYN) 500 MG tablet Take 1 tablet (500 mg total) by mouth 2 (two) times daily.  Marland Kitchen omeprazole (PRILOSEC) 20 MG capsule Take 40 mg by mouth 2 (  two) times daily.  . ondansetron (ZOFRAN) 4 MG tablet Take 1 tablet (4 mg total) by mouth 3 (three) times daily as needed.  . prazosin (MINIPRESS) 2 MG capsule Take 2 mg by mouth 2 (two) times daily.  . sertraline (ZOLOFT) 100 MG tablet Take 2 tablets (200 mg total) by mouth daily.  . XCOPRI 14 x 12.5 MG & 14 x 25 MG TBPK Take 1 tablet by mouth daily.    ROS:  Review of Systems  Respiratory: Positive for shortness of breath.   Cardiovascular: Positive for palpitations. Negative for chest pain.  Neurological: Positive for dizziness (near syncope).     Objective:   Today's Vitals: BP 116/70   Pulse 78   Temp 97.9 F (36.6 C) (Temporal)   Ht 5' 5.75" (1.67 m)   Wt 136 lb 6.4 oz (61.9 kg)   SpO2 97%   BMI 22.18 kg/m  Vitals with BMI 09/21/2020 09/06/2020 09/06/2020  Height 5' 5.75" - 5' 5.75"   Weight 136 lbs 6 oz - 138 lbs  BMI 40.98 - 11.91  Systolic 478 295 621  Diastolic 70 88 85  Pulse 78 80 84     Physical Exam Vitals reviewed.  Constitutional:      Appearance: Normal appearance.  HENT:     Head: Normocephalic and atraumatic.  Cardiovascular:     Rate and Rhythm: Normal rate and regular rhythm.  Pulmonary:     Effort: Pulmonary effort is normal.     Breath sounds: Normal breath sounds.  Musculoskeletal:     Cervical back: Neck supple.  Skin:    General: Skin is warm and dry.  Neurological:     Mental Status: He is alert and oriented to person, place, and time.  Psychiatric:        Mood and Affect: Mood normal.        Behavior: Behavior normal.        Thought Content: Thought content normal.        Judgment: Judgment normal.       EKG: Sinus rhythm   Assessment and Plan   1. Syncope, unspecified syncope type   2. Near syncope   3. Hypertension, unspecified type   4. Palpitations   5. Normocytic anemia   6. Spinal stenosis of sacral region      Plan: 1.-4.  We did discuss that paragangliomas are very rare.  They do have somewhat of a genetic predisposition so because he is concerned about this and does have a history of cardiac palpitations as well as elevated blood pressure we will check a 24-hour urine for metanephrines and catecholamines for further evaluation.  If these come back elevated may consider CT scan of chest abdomen and pelvis.  He will also follow-up with cardiology as scheduled for evaluation of his palpitations and I have also recommended he discuss near syncopal episodes.  I also discussed that it could be he is just changing positions very fast recommend he change positions slowly.  We will also send him for cardiac echocardiogram to check the anatomy of his heart. 5.  We will check hemoglobin electrophoresis today.  He will also follow-up with hematology as scheduled. 6.  He was encouraged to follow with neurosurgery and to call  them to get this appointment scheduled.  He tells me he understands.   Tests ordered Orders Placed This Encounter  Procedures  . Metanephrines, urine, 24 hour  . Catecholamines, fractionated, urine, 24 hour  . Hemoglobinopathy Evaluation  .  EKG 12-Lead  . ECHOCARDIOGRAM COMPLETE      No orders of the defined types were placed in this encounter.   Patient to follow-up as scheduled in July, or sooner as needed.  Ailene Ards, NP

## 2020-09-21 NOTE — Telephone Encounter (Signed)
Faythe Ghee will work on this

## 2020-09-23 LAB — HEMOGLOBINOPATHY EVALUATION
Fetal Hemoglobin Testing: <1 % (ref 0.0–1.9)
HCT: 38 % — ABNORMAL LOW (ref 38.5–50.0)
Hemoglobin A2 - HGBRFX: 2.4 % (ref 2.2–3.2)
Hemoglobin: 12.5 g/dL — ABNORMAL LOW (ref 13.2–17.1)
Hgb A: 97.6 % (ref >96.0)
MCH: 30.2 pg (ref 27.0–33.0)
MCV: 91.8 fL (ref 80.0–100.0)
RBC: 4.14 10*6/uL — ABNORMAL LOW (ref 4.20–5.80)
RDW: 13.6 % (ref 11.0–15.0)

## 2020-09-26 ENCOUNTER — Inpatient Hospital Stay (HOSPITAL_COMMUNITY): Payer: BC Managed Care – PPO | Attending: Hematology | Admitting: Hematology

## 2020-09-26 ENCOUNTER — Inpatient Hospital Stay (HOSPITAL_COMMUNITY): Payer: BC Managed Care – PPO

## 2020-09-26 ENCOUNTER — Other Ambulatory Visit: Payer: Self-pay

## 2020-09-26 VITALS — BP 128/74 | HR 101 | Temp 97.3°F | Resp 18 | Ht 66.0 in | Wt 134.9 lb

## 2020-09-26 DIAGNOSIS — D649 Anemia, unspecified: Secondary | ICD-10-CM | POA: Diagnosis not present

## 2020-09-26 DIAGNOSIS — F4310 Post-traumatic stress disorder, unspecified: Secondary | ICD-10-CM | POA: Insufficient documentation

## 2020-09-26 DIAGNOSIS — I10 Essential (primary) hypertension: Secondary | ICD-10-CM | POA: Diagnosis not present

## 2020-09-26 DIAGNOSIS — F17210 Nicotine dependence, cigarettes, uncomplicated: Secondary | ICD-10-CM | POA: Diagnosis not present

## 2020-09-26 DIAGNOSIS — R561 Post traumatic seizures: Secondary | ICD-10-CM | POA: Insufficient documentation

## 2020-09-26 DIAGNOSIS — K589 Irritable bowel syndrome without diarrhea: Secondary | ICD-10-CM | POA: Insufficient documentation

## 2020-09-26 DIAGNOSIS — F1220 Cannabis dependence, uncomplicated: Secondary | ICD-10-CM | POA: Insufficient documentation

## 2020-09-26 DIAGNOSIS — Z832 Family history of diseases of the blood and blood-forming organs and certain disorders involving the immune mechanism: Secondary | ICD-10-CM | POA: Diagnosis not present

## 2020-09-26 DIAGNOSIS — F419 Anxiety disorder, unspecified: Secondary | ICD-10-CM | POA: Insufficient documentation

## 2020-09-26 DIAGNOSIS — Z8349 Family history of other endocrine, nutritional and metabolic diseases: Secondary | ICD-10-CM | POA: Insufficient documentation

## 2020-09-26 LAB — COMPREHENSIVE METABOLIC PANEL
ALT: 15 U/L (ref 0–44)
AST: 20 U/L (ref 15–41)
Albumin: 4.6 g/dL (ref 3.5–5.0)
Alkaline Phosphatase: 63 U/L (ref 38–126)
Anion gap: 8 (ref 5–15)
BUN: 14 mg/dL (ref 6–20)
CO2: 26 mmol/L (ref 22–32)
Calcium: 9.3 mg/dL (ref 8.9–10.3)
Chloride: 102 mmol/L (ref 98–111)
Creatinine, Ser: 0.89 mg/dL (ref 0.61–1.24)
GFR, Estimated: >60 mL/min (ref >60)
Glucose, Bld: 86 mg/dL (ref 70–99)
Potassium: 3.9 mmol/L (ref 3.5–5.1)
Sodium: 136 mmol/L (ref 135–145)
Total Bilirubin: 0.5 mg/dL (ref 0.3–1.2)
Total Protein: 7.5 g/dL (ref 6.5–8.1)

## 2020-09-26 LAB — CBC WITH DIFFERENTIAL/PLATELET
Abs Immature Granulocytes: 0.04 10*3/uL (ref 0.00–0.07)
Basophils Absolute: 0 10*3/uL (ref 0.0–0.1)
Basophils Relative: 0 %
Eosinophils Absolute: 0.4 10*3/uL (ref 0.0–0.5)
Eosinophils Relative: 5 %
HCT: 38.3 % — ABNORMAL LOW (ref 39.0–52.0)
Hemoglobin: 13 g/dL (ref 13.0–17.0)
Immature Granulocytes: 1 %
Lymphocytes Relative: 29 %
Lymphs Abs: 2.5 10*3/uL (ref 0.7–4.0)
MCH: 31.2 pg (ref 26.0–34.0)
MCHC: 33.9 g/dL (ref 30.0–36.0)
MCV: 91.8 fL (ref 80.0–100.0)
Monocytes Absolute: 0.6 10*3/uL (ref 0.1–1.0)
Monocytes Relative: 7 %
Neutro Abs: 5.1 10*3/uL (ref 1.7–7.7)
Neutrophils Relative %: 58 %
Platelets: 243 10*3/uL (ref 150–400)
RBC: 4.17 MIL/uL — ABNORMAL LOW (ref 4.22–5.81)
RDW: 13.2 % (ref 11.5–15.5)
WBC: 8.6 10*3/uL (ref 4.0–10.5)
nRBC: 0 % (ref 0.0–0.2)

## 2020-09-26 LAB — SEDIMENTATION RATE: Sed Rate: 10 mm/hr (ref 0–16)

## 2020-09-26 LAB — IRON AND TIBC
Iron: 120 ug/dL (ref 45–182)
Saturation Ratios: 32 % (ref 17.9–39.5)
TIBC: 379 ug/dL (ref 250–450)
UIBC: 259 ug/dL

## 2020-09-26 LAB — VITAMIN D 25 HYDROXY (VIT D DEFICIENCY, FRACTURES): Vit D, 25-Hydroxy: 29.47 ng/mL — ABNORMAL LOW (ref 30–100)

## 2020-09-26 LAB — RETICULOCYTES
Immature Retic Fract: 8.6 % (ref 2.3–15.9)
RBC.: 4.24 MIL/uL (ref 4.22–5.81)
Retic Count, Absolute: 79.7 10*3/uL (ref 19.0–186.0)
Retic Ct Pct: 1.9 % (ref 0.4–3.1)

## 2020-09-26 LAB — LACTATE DEHYDROGENASE: LDH: 116 U/L (ref 98–192)

## 2020-09-26 LAB — FERRITIN: Ferritin: 44 ng/mL (ref 24–336)

## 2020-09-26 LAB — VITAMIN B12: Vitamin B-12: 275 pg/mL (ref 180–914)

## 2020-09-26 LAB — C-REACTIVE PROTEIN: CRP: 0.5 mg/dL (ref <1.0)

## 2020-09-26 LAB — FOLATE: Folate: 12.3 ng/mL (ref >5.9)

## 2020-09-26 NOTE — Patient Instructions (Signed)
Gregg Gibson at [facility] Discharge Instructions  You were seen today by Dr. Delton Coombes and Tarri Abernethy PA-C for your anemia.  We have ordered some lab tests to determine why you are anemic.    LABS: Get labs today before you leave the hospital building.   OTHER TESTS: Complete stool cards as directed and return them to the hospital lab ASAP  MEDICATIONS: No changes  FOLLOW-UP APPOINTMENT: 2-3 weeks to discuss lab results   Thank you for choosing [facility] at [facility] to provide your oncology and hematology care.  To afford each patient quality time with our provider, please arrive at least 15 minutes before your scheduled appointment time.   If you have a lab appointment with the [place] [facility] please come in thru the Main Entrance and check in at the main information desk.  You need to re-schedule your appointment should you arrive 10 or more minutes late.  We strive to give you quality time with our providers, and arriving late affects you and other patients whose appointments are after yours.  Also, if you no show three or more times for appointments you may be dismissed from the clinic at the providers discretion.     Again, thank you for choosing [facility].  Our hope is that these requests will decrease the amount of time that you wait before being seen by our physicians.       _____________________________________________________________  Should you have questions after your visit to [facility], please contact our office at [phone] and follow the prompts.  Our office hours are 8:00 a.m. and 4:30 p.m. Monday - Friday.  Please note that voicemails left after 4:00 p.m. may not be returned until the following business day.  We are closed weekends and major holidays.  You do have access to a nurse 24-7, just call the main number to the clinic [phone] and do not press any options, hold on the line  and a nurse will answer the phone.    For prescription refill requests, have your pharmacy contact our office and allow 72 hours.    Due to Covid, you will need to wear a mask upon entering the hospital. If you do not have a mask, a mask will be given to you at the Main Entrance upon arrival. For doctor visits, patients may have 1 support person age 68 or older with them. For treatment visits, patients can not have anyone with them due to social distancing guidelines and our immunocompromised population.

## 2020-09-26 NOTE — Progress Notes (Signed)
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 caudal most well-formed intervertebral disc space is designated L5-S1. Alignment: Straightening of the expected lumbar lordosis. Trace L2-L3 L3-L4 and L4-L5 grade 1 retrolisthesis. 3 mm L5-S1 grade 1 retrolisthesis. Vertebrae: Vertebral body height is maintained. No significant marrow edema or focal suspicious osseous lesion is identified. Conus medullaris and cauda equina: Conus extends to the T12-L1 level. No signal abnormality within the visualized distal spinal cord. Paraspinal and other soft tissues: No abnormality identified within included portions of the abdomen/retroperitoneum. Paraspinal soft  tissues within normal limits. Disc levels: Mild-to-moderate disc degeneration at L5-S1. Intervertebral disc height and hydration are otherwise maintained within the lumbar spine. Congenitally narrow lumbar spinal canal. L1-L2: No significant disc herniation or stenosis. L2-L3: Trace retrolisthesis. Small disc bulge. No significant spinal canal or foraminal stenosis. L3-L4: Trace retrolisthesis. Small disc bulge. No significant spinal canal or foraminal stenosis. L4-L5: Trace retrolisthesis. Small disc bulge. Mild facet arthrosis. Mild bilateral subarticular narrowing without nerve root impingement. Central canal patent. Mild bilateral neural foraminal narrowing. L5-S1: Grade 1 retrolisthesis. Disc bulge with endplate spurring. Superimposed left center/subarticular disc protrusion, with slight caudal migration and associated osteophyte ridge. Mild facet arthrosis. Evaluation is limited at this level due to motion degradation. However, the disc protrusion appears to result in significant left subarticular stenosis, encroaching upon descending left-sided nerve roots (most notably the descending left S1 nerve root) (series 9, image 28). Mild right subarticular and central canal narrowing. Moderate bilateral neural foraminal narrowing. IMPRESSION: Examination limited by motion degradation. Lumbar spondylosis as outlined and with findings most notably as follows. At L5-S1, there is grade 1 retrolisthesis. Mild/moderate disc degeneration. Disc bulge with endplate spurring. Superimposed left center/subarticular disc protrusion, with slight caudal migration and with associated osteophyte ridge. Mild facet arthrosis. Evaluation is limited at this level due to motion degradation. However, the disc protrusion appears to result in significant left subarticular stenosis, encroaching upon descending left-sided nerve roots (most notably the descending left S1 nerve root). Correlate for left S1 radiculopathy. Mild right subarticular  and central canal narrowing. Moderate bilateral neural foraminal narrowing. At L4-L5, there is multifactorial mild bilateral  subarticular narrowing without appreciable nerve root impingement. Mild bilateral neural foraminal narrowing. Electronically Signed   By: Kellie Simmering DO   On: 09/12/2020 13:47    ASSESSMENT: 1.  Normocytic anemia -Review of labs show normal Hgb 15.3 on 07/28/2020, slowly dropped to Hgb 11.7 on 09/05/2020.  Most recently Hgb 12.5 on 09/21/2020.  MCV normal at 91.8.  Iron panel on 09/05/2020 shows ferritin 72, serum iron 84.  Hemoglobinopathy panel on 09/21/2020 was normal. - Reports previous hematochezia, but denies any blood loss in the last 2 months - Endoscopy (06/21/2020) demonstrated nonerosive gastritis, small hiatal hernia, and biopsies negative for H. pylori and celiac.  - Colonoscopy (06/21/2020) demonstrated minimal erythema throughout the colon, no evidence of mucosal ulceration such as erosions, ulcers, or friability; biopsies were negative for microscopic colitis.  Per GI note 08/10/2020, Dr. Philippa Chester suspected that hemorrhoids may be the cause of intermittent rectal bleeding. - Patient takes iron supplements at home - Symptomatic with fatigue  2.  Other history - The patient reports a history of Crohn's disease (but per GI note his workup was not convincing for Crohn's disease and clinical picture favors IBS).   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  He is accompanied by Gre[facility] dog [facility]). - He lives with his boyfriend. He smokes marijuana (had a medical marijuana card in [place]), which helped with his abdominal symptoms and anxiety.  He smokes 1 to 2 cigarettes/day.  He denies alcohol and other illicit use. - Patient reports that his family history is positive for a grandmother and sister with unspecified anemia.  His grandmother and uncle also had a  paraganglioma.  Sister has Ehlers-Danlos syndrome.    PLAN:  1.  Normocytic anemia - Unclear etiology at this time, under work-up -We will check repeat CBC as well as CMP, ferritin, iron/TIBC, LDH, reticulocytes, haptoglonin, folate, copper, methylmalonic acid, vitamin B12, vitamin D - Check stool cards x 3 - RTC 2 to 3 weeks to discuss results and next steps   PLAN SUMMARY & DISPOSITION: - Labs today, RTC in 2-3 weeks to discuss results   All questions were answered. The patient knows to call the clinic with any problems, questions or concerns.  Medical decision making: Moderate (1 new problem with uncertain prognosis under work-up, review of external notes, review of prior test, ordering new tests)  Time spent on visit: I spent 30 minutes counseling the patient face to face. The total time spent in the appointment was 40 minutes and more than 50% was on counseling.  I, Tarri Abernethy PA-C, have seen this patient in conjunction with Dr. Derek Jack. Greater than 50% of visit was performed by Dr. Delton Coombes.  Addendum: I have independently evaluated this patient and agree with HPI written by Tarri Abernethy, PA-C.  Normocytic anemia of unknown etiology from his baseline of hemoglobin 13-15.  Denies any bleeding per rectum or melena.  We will check for common nutritional deficiencies and stool for occult blood.  We will see him back in 2 to 3 weeks to discuss results.    Derek Jack, MD 09/26/20 5:13 PM

## 2020-09-27 LAB — HAPTOGLOBIN: Haptoglobin: 140 mg/dL (ref 17–317)

## 2020-09-28 LAB — COPPER, SERUM: Copper: 92 ug/dL (ref 63–121)

## 2020-09-30 ENCOUNTER — Ambulatory Visit: Payer: BC Managed Care – PPO | Admitting: Cardiology

## 2020-09-30 LAB — METHYLMALONIC ACID, SERUM: Methylmalonic Acid, Quantitative: 171 nmol/L (ref 0–378)

## 2020-10-03 LAB — CATECHOLAMINES, FRACTIONATED, 24-HOUR UR W/O CREATININE
Calculated Total (E+NE): 31 mcg/24 h (ref 26–121)
Dopamine, 24 hr Urine: 175 mcg/24 h (ref 52–480)
Epinephrine, 24 hr Urine: 7 mcg/24 h (ref 2–24)
Norepinephrine, 24 hr Ur: 24 mcg/24 h (ref 15–100)
Volume, Urine-VMAUR: 2600 mL/24 h

## 2020-10-03 LAB — METANEPHRINES, URINE, 24 HOUR
METANEPHRINE: 197 mcg/24 h (ref 25–222)
METANEPHRINES, TOTAL: 347 mcg/24 h (ref 94–604)
NORMETANEPHRINE: 150 mcg/24 h (ref 40–412)
Total Volume: 2600 mL

## 2020-10-04 ENCOUNTER — Ambulatory Visit: Payer: BC Managed Care – PPO | Admitting: Neurology

## 2020-10-04 ENCOUNTER — Encounter: Payer: Self-pay | Admitting: Neurology

## 2020-10-05 ENCOUNTER — Encounter (HOSPITAL_COMMUNITY): Payer: Self-pay

## 2020-10-05 ENCOUNTER — Encounter (INDEPENDENT_AMBULATORY_CARE_PROVIDER_SITE_OTHER): Payer: Self-pay | Admitting: Nurse Practitioner

## 2020-10-07 ENCOUNTER — Encounter (INDEPENDENT_AMBULATORY_CARE_PROVIDER_SITE_OTHER): Payer: Self-pay | Admitting: Nurse Practitioner

## 2020-10-10 ENCOUNTER — Other Ambulatory Visit (INDEPENDENT_AMBULATORY_CARE_PROVIDER_SITE_OTHER): Payer: Self-pay | Admitting: Internal Medicine

## 2020-10-10 MED ORDER — PRAZOSIN HCL 2 MG PO CAPS: 2.0000 mg | ORAL_CAPSULE | Freq: Two times a day (BID) | ORAL | 1 refills | Status: DC

## 2020-10-17 ENCOUNTER — Inpatient Hospital Stay (HOSPITAL_BASED_OUTPATIENT_CLINIC_OR_DEPARTMENT_OTHER): Payer: BC Managed Care – PPO | Admitting: Physician Assistant

## 2020-10-17 ENCOUNTER — Encounter (INDEPENDENT_AMBULATORY_CARE_PROVIDER_SITE_OTHER): Payer: Self-pay | Admitting: Nurse Practitioner

## 2020-10-17 ENCOUNTER — Ambulatory Visit (INDEPENDENT_AMBULATORY_CARE_PROVIDER_SITE_OTHER): Payer: BC Managed Care – PPO | Admitting: Nurse Practitioner

## 2020-10-17 ENCOUNTER — Other Ambulatory Visit: Payer: Self-pay

## 2020-10-17 VITALS — BP 130/80 | HR 100 | Temp 98.0°F | Resp 18 | Ht 66.0 in | Wt 128.0 lb

## 2020-10-17 VITALS — BP 136/84 | HR 87 | Temp 97.2°F | Resp 18 | Wt 128.1 lb

## 2020-10-17 DIAGNOSIS — R600 Localized edema: Secondary | ICD-10-CM | POA: Diagnosis not present

## 2020-10-17 DIAGNOSIS — R790 Abnormal level of blood mineral: Secondary | ICD-10-CM | POA: Diagnosis not present

## 2020-10-17 DIAGNOSIS — Z8349 Family history of other endocrine, nutritional and metabolic diseases: Secondary | ICD-10-CM | POA: Diagnosis not present

## 2020-10-17 DIAGNOSIS — F17210 Nicotine dependence, cigarettes, uncomplicated: Secondary | ICD-10-CM | POA: Diagnosis not present

## 2020-10-17 DIAGNOSIS — K589 Irritable bowel syndrome without diarrhea: Secondary | ICD-10-CM | POA: Diagnosis not present

## 2020-10-17 DIAGNOSIS — F419 Anxiety disorder, unspecified: Secondary | ICD-10-CM | POA: Diagnosis not present

## 2020-10-17 DIAGNOSIS — Z832 Family history of diseases of the blood and blood-forming organs and certain disorders involving the immune mechanism: Secondary | ICD-10-CM | POA: Diagnosis not present

## 2020-10-17 DIAGNOSIS — R569 Unspecified convulsions: Secondary | ICD-10-CM

## 2020-10-17 DIAGNOSIS — I10 Essential (primary) hypertension: Secondary | ICD-10-CM | POA: Diagnosis not present

## 2020-10-17 DIAGNOSIS — R561 Post traumatic seizures: Secondary | ICD-10-CM | POA: Diagnosis not present

## 2020-10-17 DIAGNOSIS — E559 Vitamin D deficiency, unspecified: Secondary | ICD-10-CM

## 2020-10-17 DIAGNOSIS — R42 Dizziness and giddiness: Secondary | ICD-10-CM | POA: Diagnosis not present

## 2020-10-17 DIAGNOSIS — F4310 Post-traumatic stress disorder, unspecified: Secondary | ICD-10-CM | POA: Diagnosis not present

## 2020-10-17 DIAGNOSIS — D649 Anemia, unspecified: Secondary | ICD-10-CM | POA: Diagnosis not present

## 2020-10-17 MED ORDER — MECLIZINE HCL 25 MG PO TABS: 25.0000 mg | ORAL_TABLET | Freq: Three times a day (TID) | ORAL | 0 refills | Status: AC | PRN

## 2020-10-17 MED ORDER — FERROUS SULFATE 325 (65 FE) MG PO TBEC: 325.0000 mg | DELAYED_RELEASE_TABLET | Freq: Three times a day (TID) | ORAL | 11 refills | Status: AC

## 2020-10-17 MED ORDER — VITAMIN D 25 MCG (1000 UNIT) PO TABS
1000.0000 [IU] | ORAL_TABLET | Freq: Every day | ORAL | 11 refills | Status: DC
Start: 2020-10-17 — End: 2020-11-22

## 2020-10-17 NOTE — Progress Notes (Signed)
Subjective:  Patient ID: Gregg Gibson, male    DOB: 04-Dec-2000  Age: 20 y.o. MRN: [id number]  CC:  Chief Complaint  Patient presents with  . Other    Family history of paraganglioma, dizziness, lumps to lower extremities/swelling      HPI  This patient arrives today for the above.  Family history of paraganglioma: He has a family history of paraganglioma and a personal history of elevated blood pressure.  He does continue on carvedilol and is also on prazosin which he was originally placed on by psychiatry for treatment of his PTSD and nightmares.  He does monitor his blood pressure at home intermittently with a automatic wrist cuff.  We did do a 24-hour urine fractionated metanephrines and catecholamines collection which came back normal.  He does report that he does have intermittent episodes of nausea, headache, diaphoresis and as stated above has a history of elevated blood pressure.  The urine that was collected was not collected during 1 of these episodes.  Dizziness: He does have intermittent dizziness and that seems to have gotten worse over the weekend since after his seizure.  He is wondering if he can trial meclizine to see if this will help his seizure as he does report history of nystagmus.  He also mentions that his biological sister recently reported that she was diagnosed with cardiomyopathy.  He is tells me that he has a history of being diagnosed with an enlarged heart when he was hospitalized in the past prior to moving to this area.  Lumps to lower extremities/intermittent swelling: He mentions she has intermittent lumps to his lower extremity and sometimes his feet and lower legs will swell.  Does not sound very significant, however can be uncomfortable at times.  He tells me the swelling seems to worsen as the day goes on, and after sleeping at night the swelling improves in the morning.  He does not currently wear compression stockings on his feet, but will wear  compression clothing on his extremities as needed.  He wears these for restless leg syndrome.  Of note, he does report having had a seizure over the weekend.  He plans on calling his neurologist regarding his Raye Sorrow because his pharmacy has been out of stock of medicine so he has not been taking it for about a week.   Past Medical History:  Diagnosis Date  . Acute kidney injury ([place])   . Conversion disorder   . Crohn's disease ([place])   . PTSD (post-traumatic stress disorder)   . Seizures ([place])   . TBI (traumatic brain injury) ([place])       Family History  Problem Relation Age of Onset  . Other Sister        EDS  . Ehlers-Danlos syndrome Sister   . Scoliosis Mother   . Scoliosis Maternal Grandmother     Social History   Social History Narrative  . Not on file   Social History   Tobacco Use  . Smoking status: Current Every Day Smoker    Packs/day: 0.25    Types: Cigarettes  . Smokeless tobacco: Never Used  Substance Use Topics  . Alcohol use: Never     Current Meds  Medication Sig  . albuterol (VENTOLIN HFA) 108 (90 Base) MCG/ACT inhaler Inhale 1-2 puffs into the lungs every 6 (six) hours as needed for wheezing or shortness of breath.  . carvedilol (COREG) 25 MG tablet Take 1 tablet by mouth in the morning and  at bedtime.  . hydrOXYzine (ATARAX/VISTARIL) 25 MG tablet Take 1 tablet (25 mg total) by mouth daily as needed.  . hyoscyamine (LEVBID) 0.375 MG 12 hr tablet Take 0.375 mg by mouth 2 (two) times daily.  Marland Kitchen lamoTRIgine (LAMICTAL) 200 MG tablet Take 1 tablet by mouth in the morning and at bedtime. Extended release.  . LamoTRIgine 200 MG TB24 24 hour tablet Take 1 tablet by mouth in the morning.  . meclizine (ANTIVERT) 25 MG tablet Take 1 tablet (25 mg total) by mouth 3 (three) times daily as needed for dizziness.  . naproxen (NAPROSYN) 500 MG tablet Take 1 tablet (500 mg total) by mouth 2 (two) times daily.  Marland Kitchen omeprazole (PRILOSEC) 20 MG capsule Take 40 mg by mouth 2  (two) times daily.  . ondansetron (ZOFRAN) 4 MG tablet Take 1 tablet (4 mg total) by mouth 3 (three) times daily as needed.  . prazosin (MINIPRESS) 2 MG capsule Take 1 capsule (2 mg total) by mouth 2 (two) times daily.  . sertraline (ZOLOFT) 100 MG tablet Take 2 tablets (200 mg total) by mouth daily.  . XCOPRI 14 x 12.5 MG & 14 x 25 MG TBPK Take 1 tablet by mouth daily.    ROS:  Review of Systems  Respiratory: Negative for shortness of breath.   Cardiovascular: Positive for leg swelling (intermittently) and PND (reports this will occur intermittently). Negative for chest pain and orthopnea.  Neurological: Positive for dizziness, seizures and headaches.     Objective:   Today's Vitals: BP 130/80 (BP Location: Right Arm, Patient Position: Sitting, Cuff Size: Normal)   Pulse 100   Temp 98 F (36.7 C) (Temporal)   Resp 18   Ht 5' 6"  (1.676 m)   Wt 128 lb (58.1 kg)   BMI 20.66 kg/m  Vitals with BMI 10/17/2020 09/26/2020 09/21/2020  Height 5' 6"  5' 6"  5' 5.75"  Weight 128 lbs 134 lbs 15 oz 136 lbs 6 oz  BMI 20.67 70.62 37.62  Systolic 831 517 616  Diastolic 80 74 70  Pulse 073 101 78     Physical Exam Vitals reviewed.  Constitutional:      Appearance: Normal appearance.  HENT:     Head: Normocephalic and atraumatic.  Cardiovascular:     Rate and Rhythm: Normal rate and regular rhythm.  Pulmonary:     Effort: Pulmonary effort is normal.     Breath sounds: Normal breath sounds.  Musculoskeletal:     Cervical back: Neck supple.     Right lower leg: No edema.     Left lower leg: No edema.  Skin:    General: Skin is warm and dry.  Neurological:     Mental Status: He is alert and oriented to person, place, and time.  Psychiatric:        Mood and Affect: Mood normal.        Behavior: Behavior normal.        Thought Content: Thought content normal.        Judgment: Judgment normal.          Assessment and Plan   1. Dizziness   2. Hypertension, unspecified type    3. Seizures ([place])   4. Bilateral lower extremity edema      Plan: 1., 3.-4.  I think it is reasonable for him to trial meclizine.  He will follow-up with his neurologist regarding getting his seizure medication filled.  He is already scheduled for cardiac echocardiogram for further evaluation of  near syncopal episode in the past as well as his current dizziness and reports of lower extremity edema.  No edema noted on exam today.  The areas of concern regarding the lumps that he is feeling appear to be veins in his legs from what I can tell on exam.  For now I have recommended that he try compression stockings as needed, and further recommendations may be made based upon echocardiogram results.  He is also scheduled to see cardiology and does plan on keeping this appointment next month. 2.  I believe he is at low risk for having paraganglioma.  However, we will get repeat 24-hour urine fractionated metanephrines and catecholamines if he experiences any episodes where he is having elevated blood pressure, dizziness, headache, and/or nausea.  If this were to occur he will initiate 24-hour urine collection and will drop it off at the office as soon as possible.  We will be order the test.  If levels come back elevated may consider imaging or further testing for paraganglioma.   Tests ordered No orders of the defined types were placed in this encounter.     Meds ordered this encounter  Medications  . meclizine (ANTIVERT) 25 MG tablet    Sig: Take 1 tablet (25 mg total) by mouth 3 (three) times daily as needed for dizziness.    Dispense:  30 tablet    Refill:  0    Order Specific Question:   Supervising Provider    Answer:   Doree Albee [[id number]]    Patient to follow-up as scheduled in July or sooner as needed. I spent 35 minutes dedicated to the care of this patient on the date of this encounter which includes a combination of either face-to-face or virtual contact with the patient, review  of Previous records/ordered imaging tests, and ordering of tests and/or procedures.   Ailene Ards, NP
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
## 2020-10-17 NOTE — Patient Instructions (Signed)
[place] at [facility] Discharge Instructions  You were seen today by Tarri Abernethy PA-C for your anemia.  Your blood counts and labs look great! The only mild abnormality was low Vitamin D and borderline iron.  Start taking Vitamin D 1,000 units daily.  Take ferrous sulfate 325 mg daily.    LABS: Return in 6 months for labs.   OTHER TESTS: Return the stool cards to the lab on the first floor of the hospital.  MEDICATIONS:  Start taking Vitamin D 1,000 units daily.   Take ferrous sulfate 325 mg daily.    FOLLOW-UP APPOINTMENT: Return for follow up in 6 months   Thank you for choosing [place] at [facility] to provide your oncology and hematology care.  To afford each patient quality time with our provider, please arrive at least 15 minutes before your scheduled appointment time.   If you have a lab appointment with the [facility] please come in thru the Main Entrance and check in at the main information desk.  You need to re-schedule your appointment should you arrive 10 or more minutes late.  We strive to give you quality time with our providers, and arriving late affects you and other patients whose appointments are after yours.  Also, if you no show three or more times for appointments you may be dismissed from the clinic at the providers discretion.     Again, thank you for choosing [facility].  Our hope is that these requests will decrease the amount of time that you wait before being seen by our physicians.       _____________________________________________________________  Should you have questions after your visit to [facility], please contact our office at [phone] and follow the prompts.  Our office hours are 8:00 a.m. and 4:30 p.m. Monday - Friday.  Please note that voicemails left after 4:00 p.m. may not be returned until the following business day.  We are closed weekends and major  holidays.  You do have access to a nurse 24-7, just call the main number to the clinic [phone] and do not press any options, hold on the line and a nurse will answer the phone.    For prescription refill requests, have your pharmacy contact our office and allow 72 hours.    Due to Covid, you will need to wear a mask upon entering the hospital. If you do not have a mask, a mask will be given to you at the Main Entrance upon arrival. For doctor visits, patients may have 1 support person age 68 or older with them. For treatment visits, patients can not have anyone with them due to social distancing guidelines and our immunocompromised population.

## 2020-10-18 ENCOUNTER — Encounter (INDEPENDENT_AMBULATORY_CARE_PROVIDER_SITE_OTHER): Payer: Self-pay | Admitting: Nurse Practitioner

## 2020-10-25 ENCOUNTER — Ambulatory Visit (HOSPITAL_COMMUNITY): Admission: RE | Admit: 2020-10-25 | Payer: BC Managed Care – PPO | Source: Ambulatory Visit

## 2020-11-02 ENCOUNTER — Other Ambulatory Visit (INDEPENDENT_AMBULATORY_CARE_PROVIDER_SITE_OTHER): Payer: Self-pay | Admitting: Nurse Practitioner

## 2020-11-02 ENCOUNTER — Encounter (INDEPENDENT_AMBULATORY_CARE_PROVIDER_SITE_OTHER): Payer: Self-pay | Admitting: Nurse Practitioner

## 2020-11-02 DIAGNOSIS — I10 Essential (primary) hypertension: Secondary | ICD-10-CM

## 2020-11-02 DIAGNOSIS — G40209 Localization-related (focal) (partial) symptomatic epilepsy and epileptic syndromes with complex partial seizures, not intractable, without status epilepticus: Secondary | ICD-10-CM | POA: Diagnosis not present

## 2020-11-02 DIAGNOSIS — F4310 Post-traumatic stress disorder, unspecified: Secondary | ICD-10-CM | POA: Diagnosis not present

## 2020-11-02 DIAGNOSIS — F445 Conversion disorder with seizures or convulsions: Secondary | ICD-10-CM | POA: Diagnosis not present

## 2020-11-02 MED ORDER — CARVEDILOL 25 MG PO TABS: 25.0000 mg | ORAL_TABLET | Freq: Two times a day (BID) | ORAL | 0 refills | Status: DC

## 2020-11-03 ENCOUNTER — Other Ambulatory Visit (INDEPENDENT_AMBULATORY_CARE_PROVIDER_SITE_OTHER): Payer: Self-pay | Admitting: Nurse Practitioner

## 2020-11-03 DIAGNOSIS — I10 Essential (primary) hypertension: Secondary | ICD-10-CM

## 2020-11-03 MED ORDER — CARVEDILOL 25 MG PO TABS: 25.0000 mg | ORAL_TABLET | Freq: Two times a day (BID) | ORAL | 1 refills | Status: DC

## 2020-11-07 ENCOUNTER — Other Ambulatory Visit (INDEPENDENT_AMBULATORY_CARE_PROVIDER_SITE_OTHER): Payer: Self-pay | Admitting: Nurse Practitioner

## 2020-11-07 ENCOUNTER — Encounter (INDEPENDENT_AMBULATORY_CARE_PROVIDER_SITE_OTHER): Payer: Self-pay | Admitting: Nurse Practitioner

## 2020-11-07 DIAGNOSIS — R110 Nausea: Secondary | ICD-10-CM

## 2020-11-07 MED ORDER — ONDANSETRON HCL 4 MG PO TABS: 4.0000 mg | ORAL_TABLET | Freq: Three times a day (TID) | ORAL | 1 refills | Status: DC | PRN

## 2020-11-09 ENCOUNTER — Encounter (INDEPENDENT_AMBULATORY_CARE_PROVIDER_SITE_OTHER): Payer: Self-pay | Admitting: Nurse Practitioner

## 2020-11-17 ENCOUNTER — Other Ambulatory Visit (INDEPENDENT_AMBULATORY_CARE_PROVIDER_SITE_OTHER): Payer: Self-pay | Admitting: Nurse Practitioner

## 2020-11-17 DIAGNOSIS — R6889 Other general symptoms and signs: Secondary | ICD-10-CM

## 2020-11-20 NOTE — Progress Notes (Signed)
Cardiology Office Note   Date:  11/22/2020   ID:  Gregg Gibson, DOB [birth year], MRN [id number]  PCP:  Ailene Ards, NP  Cardiologist:   None Referring:  Ailene Ards, NP  No chief complaint on file.     History of Present Illness: Gregg Gibson is a 20 y.o. male who  is referred by Ailene Ards, NP for evaluation of syncope.      He cancelled her first new patient appt in our office and was a no show for an echocardiogram.  He has a complicated past history and I cannot find notes from her previous cardiologist.  His family has a history of Ehlers-Danlos but he has not had this diagnosis.  He said he has had a history of dizziness and syncope.  He describes palpitations when he stands up predominantly.  He describes some orthostatic symptoms.  He is somewhat vague in the description of his events.  I do see a note from a pediatric cardiologist when he was 20 years old.  He reports that there was a treadmill test a couple of years ago but I do not have these results.  When I did orthostatics he said he not had these before and has never had an echocardiogram.  He says he has had syncope with trauma not infrequently.  He also has seizures.  He reports a severe seizure with rhabdomyolysis necessitating a short time on a ventilator in the past.  He describes multiple somatic complaints including chest discomfort, pounding in his years.  He has had multiple GI complaints and has seen gastroenterologist over the years.  He reports a diagnosis of ulcerative colitis though his problem list as Crohn's.  He does not report ever being diagnosed with POTS or inappropriate sinus tachycardia.   Past Medical History:  Diagnosis Date   Acute kidney injury ([place])    Conversion disorder    Crohn's disease ([place])    PTSD (post-traumatic stress disorder)    Seizures ([place])    TBI (traumatic brain injury) ([place])     Past Surgical History:  Procedure Laterality Date   COLONOSCOPY     Has had 3  done   TONSILECTOMY, ADENOIDECTOMY, BILATERAL MYRINGOTOMY AND TUBES     UPPER GI ENDOSCOPY     Has had 2 of these done     Current Outpatient Medications  Medication Sig Dispense Refill   carvedilol (COREG) 25 MG tablet Take 1 tablet (25 mg total) by mouth in the morning and at bedtime. 180 tablet 1   ferrous sulfate 325 (65 FE) MG EC tablet Take 1 tablet (325 mg total) by mouth 3 (three) times daily with meals. 30 tablet 11   hydrOXYzine (ATARAX/VISTARIL) 25 MG tablet Take 1 tablet (25 mg total) by mouth daily as needed. 30 tablet 1   hyoscyamine (LEVBID) 0.375 MG 12 hr tablet Take 0.375 mg by mouth 2 (two) times daily.     lamoTRIgine (LAMICTAL) 100 MG tablet Take 150 mg by mouth 2 (two) times daily.     meclizine (ANTIVERT) 25 MG tablet Take 1 tablet (25 mg total) by mouth 3 (three) times daily as needed for dizziness. 30 tablet 0   naproxen (NAPROSYN) 500 MG tablet Take 1 tablet (500 mg total) by mouth 2 (two) times daily. 60 tablet 2   omeprazole (PRILOSEC) 20 MG capsule Take 40 mg by mouth 2 (two) times daily.     ondansetron (ZOFRAN-ODT) 4 MG disintegrating tablet Take  4 mg by mouth 3 (three) times daily.     prazosin (MINIPRESS) 2 MG capsule Take 1 capsule (2 mg total) by mouth 2 (two) times daily. 60 capsule 1   sertraline (ZOLOFT) 100 MG tablet Take 2 tablets (200 mg total) by mouth daily. 60 tablet 2   XCOPRI 100 MG TABS Take by mouth.     No current facility-administered medications for this visit.    Allergies:   Oseltamivir    Social History:  The patient  reports that he has been smoking cigarettes. He has been smoking an average of 0.25 packs per day. He has never used smokeless tobacco. He reports that he does not drink alcohol and does not use drugs.   Family History:  The patient's family history includes Ehlers-Danlos syndrome in his sister; Other in his sister; Scoliosis in his maternal grandmother and mother.    ROS:  Please see the history of present illness.    Otherwise, review of systems are positive for none.   All other systems are reviewed and negative.    PHYSICAL EXAM: VS:  Ht 5' 8"  (1.727 m)   Wt 120 lb (54.4 kg)   SpO2 96%   BMI 18.25 kg/m  , BMI Body mass index is 18.25 kg/m. GENERAL:  Well appearing HEENT:  Pupils equal round and reactive, fundi not visualized, oral mucosa unremarkable NECK:  No jugular venous distention, waveform within normal limits, carotid upstroke brisk and symmetric, no bruits, no thyromegaly LYMPHATICS:  No cervical, inguinal adenopathy LUNGS:  Clear to auscultation bilaterally BACK:  No CVA tenderness CHEST:  Unremarkable HEART:  PMI not displaced or sustained,S1 and S2 within normal limits, no S3, no S4, no clicks, no rubs, no murmurs ABD:  Flat, positive bowel sounds normal in frequency in pitch, no bruits, no rebound, no guarding, no midline pulsatile mass, no hepatomegaly, no splenomegaly EXT:  2 plus pulses throughout, no edema, no cyanosis no clubbing SKIN:  No rashes no nodules NEURO:  Cranial nerves II through XII grossly intact, motor grossly intact throughout PSYCH:  Cognitively intact, oriented to person place and time    EKG:  EKG is not ordered today. The ekg ordered 09/21/2020 demonstrates sinus rhythm, rate 75, right axis deviation, RSR prime V1 and V2, no acute ST-T wave changes.   Recent Labs: 07/28/2020: TSH 3.48 09/26/2020: ALT 15; BUN 14; Creatinine, Ser 0.89; Hemoglobin 13.0; Platelets 243; Potassium 3.9; Sodium 136    Lipid Panel    Component Value Date/Time   CHOL 212 (H) 07/28/2020 1534   TRIG 85 07/28/2020 1534   HDL 33 (L) 07/28/2020 1534   CHOLHDL 6.4 (H) 07/28/2020 1534   LDLCALC 160 (H) 07/28/2020 1534      Wt Readings from Last 3 Encounters:  11/22/20 120 lb (54.4 kg)  10/17/20 128 lb 1.4 oz (58.1 kg)  10/17/20 128 lb (58.1 kg)      Other studies Reviewed: Additional studies/ records that were reviewed today include: Extensive review of care  everywhere. Review of the above records demonstrates:  Please see elsewhere in the note.     ASSESSMENT AND PLAN:  SYNCOPE: He has a somewhat vague history of syncope and today he was not orthostatic in the office.  I am going to start with a 2-week event monitor as he describes some heart pounding.  Have a low threshold for an echocardiogram.  Probably most important is he is going to try to get his cardiology records from his previous cardiologist so  we do not duplicate too much of a work-up.  HTN: His blood pressure was elevated today 130s over 80s.  He has been on the meds as listed and I think this is reasonable to continue.  I will be looking at the above records to see if there is any secondary etiologies.   Current medicines are reviewed at length with the patient today.  The patient does not have concerns regarding medicines.  The following changes have been made:  no change  Labs/ tests ordered today include:   Orders Placed This Encounter  Procedures   LONG TERM MONITOR (3-14 DAYS)      Disposition:   FU with APP in one month after the monitor.      Signed, Minus Breeding, MD  11/22/2020 5:19 PM    [facility]

## 2020-11-22 ENCOUNTER — Other Ambulatory Visit: Payer: Self-pay

## 2020-11-22 ENCOUNTER — Encounter: Payer: Self-pay | Admitting: Cardiology

## 2020-11-22 ENCOUNTER — Ambulatory Visit (INDEPENDENT_AMBULATORY_CARE_PROVIDER_SITE_OTHER): Payer: BC Managed Care – PPO

## 2020-11-22 ENCOUNTER — Ambulatory Visit (INDEPENDENT_AMBULATORY_CARE_PROVIDER_SITE_OTHER): Payer: BC Managed Care – PPO | Admitting: Cardiology

## 2020-11-22 VITALS — Ht 68.0 in | Wt 120.0 lb

## 2020-11-22 DIAGNOSIS — R55 Syncope and collapse: Secondary | ICD-10-CM | POA: Diagnosis not present

## 2020-11-22 NOTE — Progress Notes (Unsigned)
Patient enrolled for [employer] to mail a 14 day ZIO XT monitor to address on file.

## 2020-11-22 NOTE — Patient Instructions (Signed)
Testing/Procedures:  Gregg Gibson- Long Term Monitor Instructions  Your physician has requested you wear a ZIO patch monitor for 14 days.  This is a single patch monitor. [employer] supplies one patch monitor per enrollment. Additional stickers are not available. Please do not apply patch if you will be having a Nuclear Stress Test,  Echocardiogram, Cardiac CT, MRI, or Chest Xray during the period you would be wearing the  monitor. The patch cannot be worn during these tests. You cannot remove and re-apply the  ZIO XT patch monitor.  Your ZIO patch monitor will be mailed 3 day USPS to your address on file. It may take 3-5 days  to receive your monitor after you have been enrolled.  Once you have received your monitor, please review the enclosed instructions. Your monitor  has already been registered assigning a specific monitor serial # to you.  Billing and Patient Assistance Program Information  We have supplied [employer] with any of your insurance information on file for billing purposes. [employer] offers a sliding scale Patient Assistance Program for patients that do not have  insurance, or whose insurance does not completely cover the cost of the ZIO monitor.  You must apply for the Patient Assistance Program to qualify for this discounted rate.  To apply, please call [employer] at [phone], select option 4, select option 2, ask to apply for  Patient Assistance Program. Gregg Gibson will ask your household income, and how many people  are in your household. They will quote your out-of-pocket cost based on that information.  [employer] will also be able to set up a 95-month interest-free payment plan if needed.  Applying the monitor   Shave hair from upper left chest.  Hold abrader disc by orange tab. Rub abrader in 40 strokes over the upper left chest as  indicated in your monitor instructions.  Clean area with 4 enclosed alcohol pads. Let dry.  Apply patch as indicated in monitor instructions.  Patch will be placed under collarbone on left  side of chest with arrow pointing upward.  Rub patch adhesive wings for 2 minutes. Remove white label marked "1". Remove the white  label marked "2". Rub patch adhesive wings for 2 additional minutes.  While looking in a mirror, press and release button in center of patch. A small green light will  flash 3-4 times. This will be your only indicator that the monitor has been turned on.  Do not shower for the first 24 hours. You may shower after the first 24 hours.  Press the button if you feel a symptom. You will hear a small click. Record Date, Time and  Symptom in the Patient Logbook.  When you are ready to remove the patch, follow instructions on the last 2 pages of Patient  Logbook. Stick patch monitor onto the last page of Patient Logbook.  Place Patient Logbook in the blue and white box. Use locking tab on box and tape box closed  securely. The blue and white box has prepaid postage on it. Please place it in the mailbox as  soon as possible. Your physician should have your test results approximately 7 days after the  monitor has [facility]  Call [employer] [phone] you have questions regarding  your ZIO XT patch monitor. Call them immediately if you see an orange light blinking on your  monitor.  If your monitor falls off in less than 4 days, contact our Monitor department at [phone]  If your monitor becomes loose or falls off after 4 days call [employer] at [phone] for  suggestions on securing your monitor    Follow-Up: At [facility], you and your health needs are our priority.  As part of our continuing mission to provide you with exceptional heart care, we have created designated Provider Care Teams.  These Care Teams include your primary Cardiologist (physician) and Advanced Practice Providers (APPs -  Physician Assistants and Nurse Practitioners) who all work together to  provide you with the care you need, when you need it.  We recommend signing up for the patient portal called "MyChart".  Sign up information is provided on this After Visit Summary.  MyChart is used to connect with patients for Virtual Visits (Telemedicine).  Patients are able to view lab/test results, encounter notes, upcoming appointments, etc.  Non-urgent messages can be sent to your provider as well.   To learn more about what you can do with MyChart, go to [email or web address].    Your next appointment:   4 week(s)  The format for your next appointment:   In Person  Provider:   You will see one of the following Advanced Practice Providers on your designated Care Team:   Rosaria Ferries, PA-C Jory Sims, DNP, ANP

## 2020-11-24 ENCOUNTER — Encounter (INDEPENDENT_AMBULATORY_CARE_PROVIDER_SITE_OTHER): Payer: Self-pay | Admitting: Nurse Practitioner

## 2020-11-24 ENCOUNTER — Other Ambulatory Visit (INDEPENDENT_AMBULATORY_CARE_PROVIDER_SITE_OTHER): Payer: Self-pay | Admitting: Nurse Practitioner

## 2020-11-24 DIAGNOSIS — F4310 Post-traumatic stress disorder, unspecified: Secondary | ICD-10-CM

## 2020-11-24 MED ORDER — SERTRALINE HCL 100 MG PO TABS: 200.0000 mg | ORAL_TABLET | Freq: Every day | ORAL | 2 refills | Status: DC

## 2020-11-24 MED ORDER — HYDROXYZINE HCL 25 MG PO TABS: 25.0000 mg | ORAL_TABLET | Freq: Every day | ORAL | 2 refills | Status: AC | PRN

## 2020-11-24 MED ORDER — PRAZOSIN HCL 2 MG PO CAPS: 2.0000 mg | ORAL_CAPSULE | Freq: Two times a day (BID) | ORAL | 2 refills | Status: DC

## 2020-11-25 DIAGNOSIS — R55 Syncope and collapse: Secondary | ICD-10-CM

## 2020-12-06 ENCOUNTER — Ambulatory Visit (INDEPENDENT_AMBULATORY_CARE_PROVIDER_SITE_OTHER): Payer: BC Managed Care – PPO | Admitting: Internal Medicine

## 2020-12-19 ENCOUNTER — Encounter (INDEPENDENT_AMBULATORY_CARE_PROVIDER_SITE_OTHER): Payer: Self-pay | Admitting: Nurse Practitioner

## 2020-12-19 ENCOUNTER — Encounter: Payer: BC Managed Care – PPO | Admitting: Physical Medicine and Rehabilitation

## 2020-12-27 ENCOUNTER — Telehealth: Payer: Self-pay

## 2020-12-27 NOTE — Telephone Encounter (Addendum)
Left voice message for patient to give office a call back for results and to inquire about the records from previous cardiologist.   ----- Message from Minus Breeding, MD sent at 12/24/2020  3:05 PM EDT ----- No significant arrhythmias.  He was going to get me some records from his previous cardiologist as he has had previous extensive work-up.  Please call him and see if he has done this. Call Mr. Graffam with the results and send results to Ailene Ards, NP

## 2020-12-28 ENCOUNTER — Ambulatory Visit (INDEPENDENT_AMBULATORY_CARE_PROVIDER_SITE_OTHER): Payer: Medicaid Other | Admitting: Nurse Practitioner

## 2020-12-28 ENCOUNTER — Encounter (INDEPENDENT_AMBULATORY_CARE_PROVIDER_SITE_OTHER): Payer: Self-pay

## 2020-12-30 ENCOUNTER — Ambulatory Visit: Payer: Medicaid Other | Admitting: Podiatry

## 2021-01-01 NOTE — Progress Notes (Signed)
Cardiology Office Note:    Date:  01/02/2021   ID:  Gregg Gibson, DOB 2001-01-10, MRN [id number]  PCP:  Ailene Ards, NP Referring MD: Ailene Ards, NP    [place]  Cardiologist:  Minus Breeding, MD   Reason for visit: F/U syncope  History of Present Illness:    Gregg Gibson is a 20 y.o. male with a family history of Ehlers-Danlos.  He has a history of seizures, dizziness, syncope, palpitations when he stands up.  He saw Dr. Percival Spanish in November 22, 2020 describes some orthostatic symptoms.  Zio patch worn for 12 days and showed no significant arrhythmias.  Today, he comes in with his boyfriend.  He complains of episodes of throbbing all over particularly when bending over, standing up or when he gets too hot.  The symptoms can last 30 to 45 minutes.  He occasionally wears compressions socks.  He is concerned about having low oxygen at night.  He notes episodes of gasping for air and having dreams of drowning.  He has seizures often, particularly at night that last 10 to 20 minutes.  If he feels a seizure coming on, he takes hydroxyzine as needed.  He smokes 5 cigarettes/day.  When we discussed his cholesterol results, he states his diet has improved since this was checked in March 2022.  He is picking up his cardiac records this week and will bring them into clinic.  He states his last diagnostic testing was done approximately 2 years ago and mentions possible left ventricular enlargement.  States his sister also has cardiac issues but is not sure exact diagnosis.   Prior CV studies: LONG TERM MONITOR (8-14 DAYS) INTERPRETATION 12/20/2020 Normal sinus rhythm, Brief run of junctional rhythm Rare ventricular ectopy No sustained arrhythmias  Past Medical History:  Diagnosis Date   Acute kidney injury ([place])    Conversion disorder    Crohn's disease ([place])    PTSD (post-traumatic stress disorder)    Seizures ([place])    TBI (traumatic brain injury) ([place])      Past Surgical History:  Procedure Laterality Date   COLONOSCOPY     Has had 3 done   TONSILECTOMY, ADENOIDECTOMY, BILATERAL MYRINGOTOMY AND TUBES     UPPER GI ENDOSCOPY     Has had 2 of these done    Current Medications: Current Meds  Medication Sig   carvedilol (COREG) 25 MG tablet Take 1 tablet (25 mg total) by mouth in the morning and at bedtime.   ferrous sulfate 325 (65 FE) MG EC tablet Take 1 tablet (325 mg total) by mouth 3 (three) times daily with meals.   hydrOXYzine (ATARAX/VISTARIL) 25 MG tablet Take 1 tablet (25 mg total) by mouth daily as needed.   hyoscyamine (LEVBID) 0.375 MG 12 hr tablet Take 0.375 mg by mouth 2 (two) times daily.   lamoTRIgine (LAMICTAL) 100 MG tablet Take 150 mg by mouth 2 (two) times daily.   meclizine (ANTIVERT) 25 MG tablet Take 1 tablet (25 mg total) by mouth 3 (three) times daily as needed for dizziness.   naproxen (NAPROSYN) 500 MG tablet Take 1 tablet (500 mg total) by mouth 2 (two) times daily.   omeprazole (PRILOSEC) 20 MG capsule Take 40 mg by mouth 2 (two) times daily.   omeprazole (PRILOSEC) 20 MG capsule Take by mouth.   ondansetron (ZOFRAN-ODT) 4 MG disintegrating tablet Take 4 mg by mouth 3 (three) times daily.   prazosin (MINIPRESS) 2 MG capsule  Take 1 capsule (2 mg total) by mouth 2 (two) times daily.   sertraline (ZOLOFT) 100 MG tablet Take 2 tablets (200 mg total) by mouth daily.   XCOPRI 100 MG TABS Take by mouth.     Allergies:   Oseltamivir   Social History   Socioeconomic History   Marital status: Single    Spouse name: Not on file   Number of children: Not on file   Years of education: Not on file   Highest education level: Not on file  Occupational History   Not on file  Tobacco Use   Smoking status: Every Day    Packs/day: 0.25    Types: Cigarettes   Smokeless tobacco: Never  Vaping Use   Vaping Use: Never used  Substance and Sexual Activity   Alcohol use: Never   Drug use: Never   Sexual activity: Not  Currently  Other Topics Concern   Not on file  Social History Narrative   Lives with boyfriend.     Social Determinants of Health   Financial Resource Strain: Not on file  Food Insecurity: Not on file  Transportation Needs: No Transportation Needs   Lack of Transportation (Medical): No   Lack of Transportation (Non-Medical): No  Physical Activity: Inactive   Days of Exercise per Week: 0 days   Minutes of Exercise per Session: 0 min  Stress: Not on file  Social Connections: Not on file     Family History: The patient's family history includes Ehlers-Danlos syndrome in his sister; Other in his sister; Scoliosis in his maternal grandmother and mother.  ROS:   Please see the history of present illness.     EKGs/Labs/Other Studies Reviewed:    Recent Labs: 07/28/2020: TSH 3.48 09/26/2020: ALT 15; BUN 14; Creatinine, Ser 0.89; Hemoglobin 13.0; Platelets 243; Potassium 3.9; Sodium 136  Recent Lipid Panel    Component Value Date/Time   CHOL 212 (H) 07/28/2020 1534   TRIG 85 07/28/2020 1534   HDL 33 (L) 07/28/2020 1534   CHOLHDL 6.4 (H) 07/28/2020 1534   LDLCALC 160 (H) 07/28/2020 1534    Physical Exam:    VS:  BP 120/80   Pulse 77   Ht 5' 8"  (1.727 m)   Wt 129 lb 3.2 oz (58.6 kg)   SpO2 98%   BMI 19.64 kg/m     Wt Readings from Last 3 Encounters:  01/02/21 129 lb 3.2 oz (58.6 kg)  11/22/20 120 lb (54.4 kg)  10/17/20 128 lb 1.4 oz (58.1 kg)     GEN:  Slender, poor eye contact, no acute distress HEENT: Normal NECK: No JVD; No carotid bruits CARDIAC: RRR, no murmurs, rubs, gallops RESPIRATORY:  Clear to auscultation without rales, wheezing or rhonchi  ABDOMEN: Soft, non-tender, non-distended MUSCULOSKELETAL: No edema; No deformity  SKIN: Warm and dry NEUROLOGIC:  Alert and oriented PSYCHIATRIC:  Normal affect   ASSESSMENT AND PLAN   Syncope & palpitations - Zio patch 11/2020 without arrhythmia - BP in normal range - Will review cardiac records when available.   Without angina or HF symptoms, will hold off on further testing until records obtained. - Continue Coreg  Suspected sleep apnea - STOP-BANG score = 5 - Ordered sleep apnea study  Hyperlipidemia - LDL 160 in March 2022. - Recommend checking fasting lipids in next 3 months since he has made dietary changes. - Discussed cholesterol lowering diets - Mediterranean diet, DASH diet, vegetarian diet, low-carbohydrate diet and avoidance of trans fats.  Discussed healthier choice  substitutes.  Nuts, high-fiber foods, and fiber supplements may also improve lipids.    Tobacco use  - Recommend tobacco cessation.  Reviewed physiologic effects of nicotine and the immediate-eventual benefits of quitting including improvement in cough/breathing and reduction in cardiovascular events.  Discussed quitting tips such as removing triggers and getting support from family/friends and Quitline [place].  Disposition: Follow-up with Dr. Percival Spanish in 6 months unless records indicate testing/visit is indicated prior to then.     Medication Adjustments/Labs and Tests Ordered: Current medicines are reviewed at length with the patient today.  Concerns regarding medicines are outlined above.  Orders Placed This Encounter  Procedures   Split night study    No orders of the defined types were placed in this encounter.   Patient Instructions  Medication Instructions:  No Changes *If you need a refill on your cardiac medications before your next appointment, please call your pharmacy*   Lab Work: No Labs If you have labs (blood work) drawn today and your tests are completely normal, you will receive your results only by: [place] (if you have MyChart) OR A paper copy in the mail If you have any lab test that is abnormal or we need to change your treatment, we will call you to review the results.   Testing/Procedures: [address] [place], [address] Your physician has recommended that you have a sleep study.  This test records several body functions during sleep, including: brain activity, eye movement, oxygen and carbon dioxide blood levels, heart rate and rhythm, breathing rate and rhythm, the flow of air through your mouth and nose, snoring, body muscle movements, and chest and belly movement.    Follow-Up: At [facility], you and your health needs are our priority.  As part of our continuing mission to provide you with exceptional heart care, we have created designated Provider Care Teams.  These Care Teams include your primary Cardiologist (physician) and Advanced Practice Providers (APPs -  Physician Assistants and Nurse Practitioners) who all work together to provide you with the care you need, when you need it.  Your next appointment:   6 month(s)  The format for your next appointment:   In Person  Provider:   Minus Breeding, MD   Other Instructions Check Lipids with PCP in 3 months.    Signed, Warren Lacy, PA-C  01/02/2021 10:26 AM    [facility]

## 2021-01-02 ENCOUNTER — Other Ambulatory Visit: Payer: Self-pay

## 2021-01-02 ENCOUNTER — Encounter: Payer: Self-pay | Admitting: Physician Assistant

## 2021-01-02 ENCOUNTER — Ambulatory Visit (INDEPENDENT_AMBULATORY_CARE_PROVIDER_SITE_OTHER): Payer: Medicaid Other | Admitting: Physician Assistant

## 2021-01-02 VITALS — BP 120/80 | HR 77 | Ht 68.0 in | Wt 129.2 lb

## 2021-01-02 DIAGNOSIS — Z720 Tobacco use: Secondary | ICD-10-CM | POA: Diagnosis not present

## 2021-01-02 DIAGNOSIS — R002 Palpitations: Secondary | ICD-10-CM | POA: Diagnosis not present

## 2021-01-02 DIAGNOSIS — R55 Syncope and collapse: Secondary | ICD-10-CM

## 2021-01-02 DIAGNOSIS — F4310 Post-traumatic stress disorder, unspecified: Secondary | ICD-10-CM

## 2021-01-02 DIAGNOSIS — E782 Mixed hyperlipidemia: Secondary | ICD-10-CM | POA: Diagnosis not present

## 2021-01-02 NOTE — Patient Instructions (Signed)
Medication Instructions:  No Changes *If you need a refill on your cardiac medications before your next appointment, please call your pharmacy*   Lab Work: No Labs If you have labs (blood work) drawn today and your tests are completely normal, you will receive your results only by: [place] (if you have MyChart) OR A paper copy in the mail If you have any lab test that is abnormal or we need to change your treatment, we will call you to review the results.   Testing/Procedures: [address] [place], [address] Your physician has recommended that you have a sleep study. This test records several body functions during sleep, including: brain activity, eye movement, oxygen and carbon dioxide blood levels, heart rate and rhythm, breathing rate and rhythm, the flow of air through your mouth and nose, snoring, body muscle movements, and chest and belly movement.    Follow-Up: At [facility], you and your health needs are our priority.  As part of our continuing mission to provide you with exceptional heart care, we have created designated Provider Care Teams.  These Care Teams include your primary Cardiologist (physician) and Advanced Practice Providers (APPs -  Physician Assistants and Nurse Practitioners) who all work together to provide you with the care you need, when you need it.  Your next appointment:   6 month(s)  The format for your next appointment:   In Person  Provider:   Minus Breeding, MD   Other Instructions Check Lipids with PCP in 3 months.

## 2021-01-03 ENCOUNTER — Other Ambulatory Visit (INDEPENDENT_AMBULATORY_CARE_PROVIDER_SITE_OTHER): Payer: Self-pay | Admitting: Nurse Practitioner

## 2021-01-03 DIAGNOSIS — R110 Nausea: Secondary | ICD-10-CM

## 2021-01-03 MED ORDER — PRAZOSIN HCL 2 MG PO CAPS: 2.0000 mg | ORAL_CAPSULE | Freq: Two times a day (BID) | ORAL | 1 refills | Status: DC

## 2021-01-19 ENCOUNTER — Encounter (INDEPENDENT_AMBULATORY_CARE_PROVIDER_SITE_OTHER): Payer: Self-pay | Admitting: Nurse Practitioner

## 2021-01-19 ENCOUNTER — Encounter (HOSPITAL_COMMUNITY): Payer: Self-pay

## 2021-01-21 ENCOUNTER — Other Ambulatory Visit: Payer: Self-pay

## 2021-01-21 ENCOUNTER — Encounter (HOSPITAL_COMMUNITY): Payer: Self-pay | Admitting: Emergency Medicine

## 2021-01-21 ENCOUNTER — Emergency Department (HOSPITAL_COMMUNITY)
Admission: EM | Admit: 2021-01-21 | Discharge: 2021-01-21 | Discharge status: Against Medical Advice | Payer: Medicaid Other | Attending: Emergency Medicine | Admitting: Emergency Medicine

## 2021-01-21 ENCOUNTER — Emergency Department (HOSPITAL_COMMUNITY): Payer: Medicaid Other

## 2021-01-21 DIAGNOSIS — W19XXXA Unspecified fall, initial encounter: Secondary | ICD-10-CM | POA: Insufficient documentation

## 2021-01-21 DIAGNOSIS — Z5321 Procedure and treatment not carried out due to patient leaving prior to being seen by health care provider: Secondary | ICD-10-CM | POA: Diagnosis not present

## 2021-01-21 DIAGNOSIS — R569 Unspecified convulsions: Secondary | ICD-10-CM | POA: Diagnosis not present

## 2021-01-21 DIAGNOSIS — R000 Tachycardia, unspecified: Secondary | ICD-10-CM | POA: Diagnosis not present

## 2021-01-21 LAB — COMPREHENSIVE METABOLIC PANEL
ALT: 14 U/L (ref 0–44)
AST: 26 U/L (ref 15–41)
Albumin: 4.5 g/dL (ref 3.5–5.0)
Alkaline Phosphatase: 69 U/L (ref 38–126)
Anion gap: 15 (ref 5–15)
BUN: 8 mg/dL (ref 6–20)
CO2: 20 mmol/L — ABNORMAL LOW (ref 22–32)
Calcium: 9.5 mg/dL (ref 8.9–10.3)
Chloride: 105 mmol/L (ref 98–111)
Creatinine, Ser: 1.06 mg/dL (ref 0.61–1.24)
GFR, Estimated: >60 mL/min (ref >60)
Glucose, Bld: 117 mg/dL — ABNORMAL HIGH (ref 70–99)
Potassium: 3.7 mmol/L (ref 3.5–5.1)
Sodium: 140 mmol/L (ref 135–145)
Total Bilirubin: 0.4 mg/dL (ref 0.3–1.2)
Total Protein: 7.2 g/dL (ref 6.5–8.1)

## 2021-01-21 LAB — CBC WITH DIFFERENTIAL/PLATELET
Abs Immature Granulocytes: 0.03 10*3/uL (ref 0.00–0.07)
Basophils Absolute: 0 10*3/uL (ref 0.0–0.1)
Basophils Relative: 1 %
Eosinophils Absolute: 0.3 10*3/uL (ref 0.0–0.5)
Eosinophils Relative: 4 %
HCT: 39.9 % (ref 39.0–52.0)
Hemoglobin: 13.3 g/dL (ref 13.0–17.0)
Immature Granulocytes: 1 %
Lymphocytes Relative: 32 %
Lymphs Abs: 2.1 10*3/uL (ref 0.7–4.0)
MCH: 31.4 pg (ref 26.0–34.0)
MCHC: 33.3 g/dL (ref 30.0–36.0)
MCV: 94.1 fL (ref 80.0–100.0)
Monocytes Absolute: 0.5 10*3/uL (ref 0.1–1.0)
Monocytes Relative: 7 %
Neutro Abs: 3.6 10*3/uL (ref 1.7–7.7)
Neutrophils Relative %: 55 %
Platelets: 184 10*3/uL (ref 150–400)
RBC: 4.24 MIL/uL (ref 4.22–5.81)
RDW: 11.7 % (ref 11.5–15.5)
WBC: 6.5 10*3/uL (ref 4.0–10.5)
nRBC: 0 % (ref 0.0–0.2)

## 2021-01-21 LAB — LIPASE, BLOOD: Lipase: 31 U/L (ref 11–51)

## 2021-01-21 NOTE — ED Triage Notes (Signed)
Patient from home, complaint of seizures today, patient also endorses a fall today related to seizure. A&Ox4. NAD.

## 2021-01-21 NOTE — ED Notes (Addendum)
Pt family stated that pt said his head hurts and he feels like he in going to have another seizure. I will inform RN

## 2021-01-21 NOTE — ED Notes (Signed)
Patient does have service dog with him.

## 2021-01-21 NOTE — ED Notes (Signed)
Pt and family member just walked out and pt screamed "go fuck yourself" I am not sure what happen I just returned back to lobby.

## 2021-01-21 NOTE — ED Provider Notes (Signed)
Emergency Medicine Provider Triage Evaluation Note  Gregg Gibson , a 20 y.o. male  was evaluated in triage.  Pt complains of multiple complaints  Increase in partial seizures, despite compliance with meds. Has HA. Multiple falls. No numbness, weakness. Followed by Neuro outpatient  Also with NBNB emesis. No abd pain. Hx of gastric ulcer. No chronic NSAIDS, EtoH. No melena or BRBPR.  No sick contacts  Review of Systems  Positive: HA, seizures, Fall, emesis Negative: Melena, numbness, abd pain, urinary complaints  Physical Exam  BP 133/88 (BP Location: Right Arm)   Pulse (!) 138   Temp 98.7 F (37.1 C) (Oral)   Resp 18   SpO2 100%  Gen:   Awake, no distress   Resp:  Normal effort  ABD:  Soft non tender MSK:   Moves extremities without difficulty  Neuro:  Smile symmetric Other:    Medical Decision Making  Medically screening exam initiated at 5:17 PM.  Appropriate orders placed.  Casmere Hollenbeck Luce was informed that the remainder of the evaluation will be completed by another provider, this initial triage assessment does not replace that evaluation, and the importance of remaining in the ED until their evaluation is complete.  HA, seizures, fall, emesis   Trevel Dillenbeck A, PA-C 01/21/21 1717    Wyvonnia Dusky, MD 01/21/21 [phone]

## 2021-01-21 NOTE — ED Notes (Addendum)
This RN & Gaffer alerted by [employer] that pt had been placed OTF; Previous note states that Sort NT advised [place] RNs of pt's HA/ feeling of impending seizure, neither Triage RN received such notification. Charge RN notified, pt discharged.

## 2021-01-21 NOTE — ED Notes (Signed)
Pt witness leaving by Registration, pt told staff that they can go f themselves.

## 2021-01-24 LAB — LAMOTRIGINE LEVEL: Lamotrigine Lvl: 16.5 ug/mL (ref 2.0–20.0)

## 2021-01-26 ENCOUNTER — Ambulatory Visit (INDEPENDENT_AMBULATORY_CARE_PROVIDER_SITE_OTHER): Payer: Medicaid Other | Admitting: Nurse Practitioner

## 2021-01-26 ENCOUNTER — Other Ambulatory Visit: Payer: Self-pay

## 2021-01-26 ENCOUNTER — Encounter: Payer: Self-pay | Admitting: Nurse Practitioner

## 2021-01-26 VITALS — BP 130/88 | HR 102 | Temp 98.2°F | Ht 68.0 in | Wt 127.4 lb

## 2021-01-26 DIAGNOSIS — Z8739 Personal history of other diseases of the musculoskeletal system and connective tissue: Secondary | ICD-10-CM | POA: Diagnosis not present

## 2021-01-26 DIAGNOSIS — Z7689 Persons encountering health services in other specified circumstances: Secondary | ICD-10-CM | POA: Diagnosis not present

## 2021-01-26 NOTE — Progress Notes (Signed)
I,Tianna Badgett,acting as a Education administrator for [employer], NP.,have documented all relevant documentation on the behalf of [employer], NP,as directed by  Bary Castilla, NP while in the presence of Bary Castilla, NP.  This visit occurred during the SARS-CoV-2 public health emergency.  Safety protocols were in place, including screening questions prior to the visit, additional usage of staff PPE, and extensive cleaning of exam room while observing appropriate contact time as indicated for disinfecting solutions.  Subjective:     Patient ID: Gregg Gibson , male    DOB: 01-16-2001 , 20 y.o.   MRN: [id number]   Chief Complaint  Patient presents with   Establish Care         HPI  Patient is here to establish care. His last PCP passed away. They closed the practice. Lately he has been very fatigued and would like some answers. He recently had blood work done to test for cancer and is awaiting those results. He is followed by neurology, [facility] in [place]. His cardiologist is [facility]. Sees the neurologist every 3 months. He was in the hospital 3-4 ago with seizures. He has focal aware seizures. He is seeing a psychiatrist in one month. Patient has a extensive medical history. Requested that we receive his medical records from his past providers. Reviewed medical history and medication list with patient.     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[place])      Family History  Problem Relation Age of Onset   Scoliosis Mother    Other Sister        EDS   Ehlers-Danlos syndrome Sister    Cancer Maternal Grandmother    Scoliosis Maternal Grandmother      Current Outpatient Medications:    carvedilol (COREG) 25 MG tablet, Take 1 tablet (25 mg total) by mouth in the morning and at  bedtime., Disp: 180 tablet, Rfl: 1   ferrous sulfate 325 (65 FE) MG EC tablet, Take 1 tablet (325 mg total) by mouth 3 (three) times daily with meals., Disp: 30 tablet, Rfl: 11   hydrOXYzine (ATARAX/VISTARIL) 25 MG tablet, Take 1 tablet (25 mg total) by mouth daily as needed., Disp: 30 tablet, Rfl: 2   hyoscyamine (LEVBID) 0.375 MG 12 hr tablet, Take 0.375 mg by mouth 2 (two) times daily., Disp: , Rfl:    lamoTRIgine (LAMICTAL) 100 MG tablet, Take 150 mg by mouth 2 (two) times daily., Disp: , Rfl:    meclizine (ANTIVERT) 25 MG tablet, Take 1 tablet (25 mg total) by mouth 3 (three) times daily as needed for dizziness., Disp: 30 tablet, Rfl: 0   naproxen (NAPROSYN) 500 MG tablet, Take 1 tablet (500 mg total) by mouth 2 (two) times daily., Disp: 60 tablet, Rfl: 2   omeprazole (PRILOSEC) 20 MG capsule, Take 40 mg by mouth 2 (two) times daily., Disp: , Rfl:    ondansetron (ZOFRAN-ODT) 4 MG disintegrating tablet, Take 4 mg by mouth 3 (three) times daily., Disp: , Rfl:    prazosin (MINIPRESS) 2 MG capsule, Take 1 capsule (2 mg total) by mouth 2 (two) times daily., Disp: 180 capsule, Rfl: 1   sertraline (ZOLOFT) 100 MG tablet, Take 2 tablets (200 mg total) by mouth daily., Disp: 60 tablet, Rfl: 2   XCOPRI 100  MG TABS, Take by mouth., Disp: , Rfl:    Allergies  Allergen Reactions   Oseltamivir Hives     Review of Systems  Constitutional:  Positive for fatigue. Negative for chills.  Eyes:  Negative for visual disturbance.  Respiratory: Negative.  Negative for cough, shortness of breath and wheezing.   Cardiovascular: Negative.  Negative for chest pain.  Gastrointestinal: Negative.  Negative for diarrhea and nausea.  Musculoskeletal:  Positive for back pain. Negative for arthralgias and myalgias.  Neurological:  Positive for seizures and weakness.    Today's Vitals   01/26/21 1530  BP: 130/88  Pulse: (!) 102  Temp: 98.2 F (36.8 C)  TempSrc: Oral  Weight: 127 lb 6.4 oz (57.8 kg)  Height: 5'  8" (1.727 m)   Body mass index is 19.37 kg/m.  Wt Readings from Last 3 Encounters:  01/26/21 127 lb 6.4 oz (57.8 kg)  01/02/21 129 lb 3.2 oz (58.6 kg)  11/22/20 120 lb (54.4 kg)    Objective:  Physical Exam Constitutional:      Appearance: Normal appearance.  HENT:     Head: Normocephalic and atraumatic.  Cardiovascular:     Rate and Rhythm: Normal rate and regular rhythm.     Pulses: Normal pulses.     Heart sounds: Normal heart sounds. No murmur heard. Pulmonary:     Effort: Pulmonary effort is normal. No respiratory distress.     Breath sounds: Normal breath sounds. No wheezing.  Skin:    General: Skin is warm and dry.  Neurological:     Mental Status: He is alert.        Assessment And Plan:     1. Establishing care with new doctor, encounter for -Patient is here to establish care. Martin Majestic over patient medical, family, social and surgical history. -Reviewed with patient their medications and any allergies  -Reviewed with patient their sexual orientation, drug/tobacco and alcohol use -Dicussed any new concerns with patient  -recommended patient comes in for a physical exam and complete blood work.  -Educated patient about the importance of annual screenings and immunizations.  -Advised patient to eat a healthy diet along with exercise for atleast 30-45 min atleast 4-5 days of the week.   2. H/O degenerative disc disease -Reviewed with patient his MRI from 09/12/20  -Noted lumbar spondylosis  - AMB referral to orthopedics   The patient was encouraged to call or send a message through [place] for any questions or concerns.   Follow up: if symptoms persist or do not get better.   Side effects and appropriate use of all the medication(s) were discussed with the patient today. Patient advised to use the medication(s) as directed by their healthcare provider. The patient was encouraged to read, review, and understand all associated package inserts and contact our office  with any questions or concerns. The patient accepts the risks of the treatment plan and had an opportunity to ask questions.   Staying healthy and adopting a healthy lifestyle for your overall health is important. You should eat 7 or more servings of fruits and vegetables per day. You should drink plenty of water to keep yourself hydrated and your kidneys healthy. This includes about 65-80+ fluid ounces of water. Limit your intake of animal fats especially for elevated cholesterol. Avoid highly processed food and limit your salt intake if you have hypertension. Avoid foods high in saturated/Trans fats. Along with a healthy diet it is also very important to maintain time for yourself to maintain  a healthy mental health with low stress levels. You should get atleast 150 min of moderate intensity exercise weekly for a healthy heart. Along with eating right and exercising, aim for at least 7-9 hours of sleep daily.  Eat more whole grains which includes barley, wheat berries, oats, brown rice and whole wheat pasta. Use healthy plant oils which include olive, soy, corn, sunflower and peanut. Limit your caffeine and sugary drinks. Limit your intake of fast foods. Limit milk and dairy products to one or two daily servings.   Patient was given opportunity to ask questions. Patient verbalized understanding of the plan and was able to repeat key elements of the plan. All questions were answered to their satisfaction.  Raman Chalet Kerwin, DNP   I, Raman Marysa Wessner have reviewed all documentation for this visit. The documentation on 01/26/21 for the exam, diagnosis, procedures, and orders are all accurate and complete.       IF YOU HAVE BEEN REFERRED TO A SPECIALIST, IT MAY TAKE 1-2 WEEKS TO SCHEDULE/PROCESS THE REFERRAL. IF YOU HAVE NOT HEARD FROM US/SPECIALIST IN TWO WEEKS, PLEASE GIVE [place] A CALL AT [phone].   THE PATIENT IS ENCOURAGED TO PRACTICE SOCIAL DISTANCING DUE TO THE COVID-19 PANDEMIC.

## 2021-01-28 ENCOUNTER — Other Ambulatory Visit (INDEPENDENT_AMBULATORY_CARE_PROVIDER_SITE_OTHER): Payer: Self-pay | Admitting: Nurse Practitioner

## 2021-01-28 DIAGNOSIS — F4310 Post-traumatic stress disorder, unspecified: Secondary | ICD-10-CM

## 2021-02-03 ENCOUNTER — Other Ambulatory Visit (HOSPITAL_COMMUNITY): Payer: Self-pay | Admitting: Surgery

## 2021-02-03 DIAGNOSIS — E559 Vitamin D deficiency, unspecified: Secondary | ICD-10-CM

## 2021-02-03 DIAGNOSIS — R790 Abnormal level of blood mineral: Secondary | ICD-10-CM

## 2021-02-03 DIAGNOSIS — D649 Anemia, unspecified: Secondary | ICD-10-CM

## 2021-02-06 ENCOUNTER — Inpatient Hospital Stay (HOSPITAL_COMMUNITY): Payer: Medicaid Other | Attending: Hematology

## 2021-02-06 ENCOUNTER — Other Ambulatory Visit: Payer: Self-pay

## 2021-02-06 DIAGNOSIS — F1220 Cannabis dependence, uncomplicated: Secondary | ICD-10-CM | POA: Insufficient documentation

## 2021-02-06 DIAGNOSIS — K589 Irritable bowel syndrome without diarrhea: Secondary | ICD-10-CM | POA: Diagnosis not present

## 2021-02-06 DIAGNOSIS — I10 Essential (primary) hypertension: Secondary | ICD-10-CM | POA: Diagnosis not present

## 2021-02-06 DIAGNOSIS — F4310 Post-traumatic stress disorder, unspecified: Secondary | ICD-10-CM | POA: Insufficient documentation

## 2021-02-06 DIAGNOSIS — R561 Post traumatic seizures: Secondary | ICD-10-CM | POA: Insufficient documentation

## 2021-02-06 DIAGNOSIS — F419 Anxiety disorder, unspecified: Secondary | ICD-10-CM | POA: Insufficient documentation

## 2021-02-06 DIAGNOSIS — Z8349 Family history of other endocrine, nutritional and metabolic diseases: Secondary | ICD-10-CM | POA: Diagnosis not present

## 2021-02-06 DIAGNOSIS — Z833 Family history of diabetes mellitus: Secondary | ICD-10-CM | POA: Insufficient documentation

## 2021-02-06 DIAGNOSIS — R790 Abnormal level of blood mineral: Secondary | ICD-10-CM

## 2021-02-06 DIAGNOSIS — E559 Vitamin D deficiency, unspecified: Secondary | ICD-10-CM

## 2021-02-06 DIAGNOSIS — F17210 Nicotine dependence, cigarettes, uncomplicated: Secondary | ICD-10-CM | POA: Diagnosis not present

## 2021-02-06 DIAGNOSIS — D649 Anemia, unspecified: Secondary | ICD-10-CM | POA: Diagnosis not present

## 2021-02-06 LAB — CBC WITH DIFFERENTIAL/PLATELET
Abs Immature Granulocytes: 0.03 10*3/uL (ref 0.00–0.07)
Basophils Absolute: 0 10*3/uL (ref 0.0–0.1)
Basophils Relative: 0 %
Eosinophils Absolute: 0.3 10*3/uL (ref 0.0–0.5)
Eosinophils Relative: 4 %
HCT: 44 % (ref 39.0–52.0)
Hemoglobin: 14.9 g/dL (ref 13.0–17.0)
Immature Granulocytes: 0 %
Lymphocytes Relative: 33 %
Lymphs Abs: 2.9 10*3/uL (ref 0.7–4.0)
MCH: 31.6 pg (ref 26.0–34.0)
MCHC: 33.9 g/dL (ref 30.0–36.0)
MCV: 93.2 fL (ref 80.0–100.0)
Monocytes Absolute: 0.7 10*3/uL (ref 0.1–1.0)
Monocytes Relative: 8 %
Neutro Abs: 5 10*3/uL (ref 1.7–7.7)
Neutrophils Relative %: 55 %
Platelets: 239 10*3/uL (ref 150–400)
RBC: 4.72 MIL/uL (ref 4.22–5.81)
RDW: 12 % (ref 11.5–15.5)
WBC: 9 10*3/uL (ref 4.0–10.5)
nRBC: 0 % (ref 0.0–0.2)

## 2021-02-06 LAB — IRON AND TIBC
Iron: 81 ug/dL (ref 45–182)
Saturation Ratios: 19 % (ref 17.9–39.5)
TIBC: 428 ug/dL (ref 250–450)
UIBC: 347 ug/dL

## 2021-02-06 LAB — VITAMIN D 25 HYDROXY (VIT D DEFICIENCY, FRACTURES): Vit D, 25-Hydroxy: 14.46 ng/mL — ABNORMAL LOW (ref 30–100)

## 2021-02-06 LAB — FERRITIN: Ferritin: 23 ng/mL — ABNORMAL LOW (ref 24–336)

## 2021-02-07 ENCOUNTER — Other Ambulatory Visit: Payer: Self-pay | Admitting: Nurse Practitioner

## 2021-02-07 DIAGNOSIS — F4310 Post-traumatic stress disorder, unspecified: Secondary | ICD-10-CM

## 2021-02-07 MED ORDER — SERTRALINE HCL 100 MG PO TABS: 200.0000 mg | ORAL_TABLET | Freq: Every day | ORAL | 1 refills | Status: DC
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
## 2021-02-13 ENCOUNTER — Inpatient Hospital Stay (HOSPITAL_BASED_OUTPATIENT_CLINIC_OR_DEPARTMENT_OTHER): Payer: Medicaid Other | Admitting: Physician Assistant

## 2021-02-13 ENCOUNTER — Other Ambulatory Visit: Payer: Self-pay

## 2021-02-13 VITALS — BP 138/91 | HR 83 | Temp 96.8°F | Resp 18 | Wt 128.7 lb

## 2021-02-13 DIAGNOSIS — F419 Anxiety disorder, unspecified: Secondary | ICD-10-CM | POA: Diagnosis not present

## 2021-02-13 DIAGNOSIS — Z8489 Family history of other specified conditions: Secondary | ICD-10-CM | POA: Diagnosis not present

## 2021-02-13 DIAGNOSIS — D649 Anemia, unspecified: Secondary | ICD-10-CM | POA: Diagnosis not present

## 2021-02-13 DIAGNOSIS — I10 Essential (primary) hypertension: Secondary | ICD-10-CM | POA: Diagnosis not present

## 2021-02-13 DIAGNOSIS — Z833 Family history of diabetes mellitus: Secondary | ICD-10-CM | POA: Diagnosis not present

## 2021-02-13 DIAGNOSIS — R790 Abnormal level of blood mineral: Secondary | ICD-10-CM

## 2021-02-13 DIAGNOSIS — F17210 Nicotine dependence, cigarettes, uncomplicated: Secondary | ICD-10-CM | POA: Diagnosis not present

## 2021-02-13 DIAGNOSIS — E559 Vitamin D deficiency, unspecified: Secondary | ICD-10-CM | POA: Diagnosis not present

## 2021-02-13 DIAGNOSIS — F4310 Post-traumatic stress disorder, unspecified: Secondary | ICD-10-CM | POA: Diagnosis not present

## 2021-02-13 DIAGNOSIS — Z8349 Family history of other endocrine, nutritional and metabolic diseases: Secondary | ICD-10-CM | POA: Diagnosis not present

## 2021-02-13 DIAGNOSIS — K589 Irritable bowel syndrome without diarrhea: Secondary | ICD-10-CM | POA: Diagnosis not present

## 2021-02-13 DIAGNOSIS — R561 Post traumatic seizures: Secondary | ICD-10-CM | POA: Diagnosis not present

## 2021-02-13 MED ORDER — ERGOCALCIFEROL 1.25 MG (50000 UT) PO CAPS: 50000.0000 [IU] | ORAL_CAPSULE | ORAL | 6 refills | Status: DC

## 2021-02-13 NOTE — Patient Instructions (Signed)
[place] at [facility] Discharge Instructions  You were seen today by Tarri Abernethy PA-C for your iron deficiency and vitamin D deficiency.    Your iron levels and vitamin D levels remain low despite oral supplementation.  I suspect that your stomach and intestines are not absorbing nutrients very well, so we will schedule you for IV iron treatment and will also prescribe an increased dose of vitamin D.  In order to rule out any blood in your bowel movements, please complete and return the stool cards sent home with you at today's appointment.  Regarding your family history of paraganglioma, we will refer you to a genetic counselor for further testing.  LABS: Return in 3 months for repeat labs  OTHER TESTS: Stool cards x3  MEDICATIONS: Vitamin D 2 (ergocalciferol) 50,000 units each week  FOLLOW-UP APPOINTMENT: Office visit in 3 months, after labs   Thank you for choosing [place] at [facility] [address] to provide your oncology and hematology care.  To afford each patient quality time with our provider, please arrive at least 15 minutes before your scheduled appointment time.   If you have a lab appointment with the [facility] please come in thru the Main Entrance and check in at the main information desk.  You need to re-schedule your appointment should you arrive 10 or more minutes late.  We strive to give you quality time with our providers, and arriving late affects you and other patients whose appointments are after yours.  Also, if you no show three or more times for appointments you may be dismissed from the clinic at the providers discretion.     Again, thank you for choosing [facility].  Our hope is that these requests will decrease the amount of time that you wait before being seen by our physicians.       _____________________________________________________________  Should you have questions after your visit to  [facility], please contact our office at [phone] and follow the prompts.  Our office hours are 8:00 a.m. and 4:30 p.m. Monday - Friday.  Please note that voicemails left after 4:00 p.m. may not be returned until the following business day.  We are closed weekends and major holidays.  You do have access to a nurse 24-7, just call the main number to the clinic [phone] and do not press any options, hold on the line and a nurse will answer the phone.    For prescription refill requests, have your pharmacy contact our office and allow 72 hours.    Due to Covid, you will need to wear a mask upon entering the hospital. If you do not have a mask, a mask will be given to you at the Main Entrance upon arrival. For doctor visits, patients may have 1 support person age 52 or older with them. For treatment visits, patients can not have anyone with them due to social distancing guidelines and our immunocompromised population.

## 2021-02-15 ENCOUNTER — Other Ambulatory Visit: Payer: Self-pay

## 2021-02-15 ENCOUNTER — Inpatient Hospital Stay (HOSPITAL_COMMUNITY): Payer: Medicaid Other

## 2021-02-15 NOTE — Progress Notes (Signed)
Pt presents today for Feraheme IV iron infusion per provider's order. Pt stated had a seizure last night and felt dizzy and nauseous this morning, and took two Zofran ODT this morning. 0825 IV was placed 0827 pt started vomiting and diaphoretic with dizziness. Pt was given a cold wash cloth. 0830 MD notified  [phone] R.Pennington-PA at bedside orders received to hold iron today. 0840 Vital signs obtained sitting and standing within normal limits see flowsheet. 0845 Pt discharged from clinic in stable condition with support dog and rescheduled for one week.

## 2021-02-16 ENCOUNTER — Ambulatory Visit: Payer: Medicaid Other | Admitting: Nurse Practitioner

## 2021-02-16 ENCOUNTER — Encounter: Payer: Self-pay | Admitting: Orthopaedic Surgery

## 2021-02-16 ENCOUNTER — Ambulatory Visit (INDEPENDENT_AMBULATORY_CARE_PROVIDER_SITE_OTHER): Payer: Medicaid Other | Admitting: Orthopaedic Surgery

## 2021-02-16 VITALS — Ht 68.0 in | Wt 125.0 lb

## 2021-02-16 DIAGNOSIS — M5126 Other intervertebral disc displacement, lumbar region: Secondary | ICD-10-CM | POA: Diagnosis not present

## 2021-02-16 DIAGNOSIS — M7062 Trochanteric bursitis, left hip: Secondary | ICD-10-CM

## 2021-02-16 MED ORDER — BUPIVACAINE HCL 0.25 % IJ SOLN
2.0000 mL | INTRAMUSCULAR | Status: AC | PRN
  Administered 2021-02-16: 2 mL via INTRA_ARTICULAR

## 2021-02-16 MED ORDER — METHYLPREDNISOLONE ACETATE 40 MG/ML IJ SUSP
40.0000 mg | INTRAMUSCULAR | Status: AC | PRN
  Administered 2021-02-16: 40 mg via INTRA_ARTICULAR

## 2021-02-16 NOTE — Progress Notes (Signed)
Office Visit Note   Patient: Gregg Gibson           Date of Birth: Oct 09, 2000           MRN: [id number] Visit Date: 02/16/2021              Requested by: Bary Castilla, NP [address] [place],  [place] [address] PCP: No primary care provider on file.   Assessment & Plan: Visit Diagnoses:  1. Trochanteric bursitis, left hip   2. Protrusion of lumbar intervertebral disc     Plan: Trochanteric injection performed with some relief.  We will follow him up in 8 weeks.  We discussed options including possible epidural versus surgical intervention.  We discussed problems with disc protrusion and poor tolerance for retrolisthesis.  Hopefully will get some relief from the trochanteric injection.  Follow-Up Instructions: Return in about 8 weeks (around 04/13/2021).   Orders:  Orders Placed This Encounter  Procedures   Large Joint Inj: L greater trochanter   No orders of the defined types were placed in this encounter.     Procedures: Large Joint Inj: L greater trochanter on 02/16/2021 10:17 AM Details: 22 G needle, lateral approach  Arthrogram: No  Medications: 40 mg methylPREDNISolone acetate 40 MG/ML; 2 mL bupivacaine 0.25 %     Clinical Data: No additional findings.   Subjective: Chief Complaint  Patient presents with   Lower Back - Pain    HPI 20 year old male with greater than 2 years history of back pain with left radicular pain.  Patient's not working currently out of school but is considering going back for some additional studies.  He has had an MRI scan in April which showed retrolisthesis at L5-S1 with disc bulge and caudal migrated disc bulge with facet arthropathy and significant left subarticular stenosis.  Mild right side narrowing L5-S1.  There was only mild changes at L4-5 all other disc are well hydrated except for L5-S1 which shows disc disc desiccation.  He denies bowel or bladder symptoms.  He has leg pain symptoms worse with  activities.  He is noted pain over his trochanter points directly to the area.  Review of Systems history of traumatic brain injury loss consciousness.  History of seizures.  He has dog with him which alerts him for seizures.  Also positive Crohn's disease PTSD.   Objective: Vital Signs: Ht 5' 8"  (1.727 m)   Wt 125 lb (56.7 kg)   BMI 19.01 kg/m   Physical Exam Constitutional:      Appearance: He is well-developed.  HENT:     Head: Normocephalic and atraumatic.     Right Ear: External ear normal.     Left Ear: External ear normal.  Eyes:     Pupils: Pupils are equal, round, and reactive to light.  Neck:     Thyroid: No thyromegaly.     Trachea: No tracheal deviation.  Cardiovascular:     Rate and Rhythm: Normal rate.  Pulmonary:     Effort: Pulmonary effort is normal.     Breath sounds: No wheezing.  Abdominal:     General: Bowel sounds are normal.     Palpations: Abdomen is soft.  Musculoskeletal:     Cervical back: Neck supple.  Skin:    General: Skin is warm and dry.     Capillary Refill: Capillary refill takes less than 2 seconds.  Neurological:     Mental Status: He is alert and oriented to person, place, and  time.  Psychiatric:        Behavior: Behavior normal.        Thought Content: Thought content normal.        Judgment: Judgment normal.    Ortho Exam patient has sciatic notch tenderness on the left positive straight leg raising 80 degrees on the left negative on the right knee and ankle jerk intact and is able heel and toe walk.  He is tender over the left trochanter none on the right.  No atrophy lower extremities.  Specialty Comments:  No specialty comments available.  ImagingCLINICAL DATA:  Low back pain, greater than 6 weeks. Hyper reflexia, bowel/bladder hesitancy. Additional history provided by scanning technologist: Patient reports low back pain radiating down both legs for 2 months.   EXAM: MRI LUMBAR SPINE WITHOUT CONTRAST    TECHNIQUE: Multiplanar, multisequence MR imaging of the lumbar spine was performed. No intravenous contrast was administered.   COMPARISON:  Lumbar spine radiographs 07/26/2020.   FINDINGS: The examination is intermittently motion degraded, limiting evaluation. Most notably, there is moderate to moderately severe motion degradation of the sagittal STIR sequence.   Segmentation: 5 lumbar vertebrae. The caudal most well-formed intervertebral disc space is designated L5-S1.   Alignment: Straightening of the expected lumbar lordosis. Trace L2-L3 L3-L4 and L4-L5 grade 1 retrolisthesis. 3 mm L5-S1 grade 1 retrolisthesis.   Vertebrae: Vertebral body height is maintained. No significant marrow edema or focal suspicious osseous lesion is identified.   Conus medullaris and cauda equina: Conus extends to the T12-L1 level. No signal abnormality within the visualized distal spinal cord.   Paraspinal and other soft tissues: No abnormality identified within included portions of the abdomen/retroperitoneum. Paraspinal soft tissues within normal limits.   Disc levels:   Mild-to-moderate disc degeneration at L5-S1. Intervertebral disc height and hydration are otherwise maintained within the lumbar spine.   Congenitally narrow lumbar spinal canal.   L1-L2: No significant disc herniation or stenosis.   L2-L3: Trace retrolisthesis. Small disc bulge. No significant spinal canal or foraminal stenosis.   L3-L4: Trace retrolisthesis. Small disc bulge. No significant spinal canal or foraminal stenosis.   L4-L5: Trace retrolisthesis. Small disc bulge. Mild facet arthrosis. Mild bilateral subarticular narrowing without nerve root impingement. Central canal patent. Mild bilateral neural foraminal narrowing.   L5-S1: Grade 1 retrolisthesis. Disc bulge with endplate spurring. Superimposed left center/subarticular disc protrusion, with slight caudal migration and associated osteophyte ridge.  Mild facet arthrosis. Evaluation is limited at this level due to motion degradation. However, the disc protrusion appears to result in significant left subarticular stenosis, encroaching upon descending left-sided nerve roots (most notably the descending left S1 nerve root) (series 9, image 28). Mild right subarticular and central canal narrowing. Moderate bilateral neural foraminal narrowing.   IMPRESSION: Examination limited by motion degradation.   Lumbar spondylosis as outlined and with findings most notably as follows.   At L5-S1, there is grade 1 retrolisthesis. Mild/moderate disc degeneration. Disc bulge with endplate spurring. Superimposed left center/subarticular disc protrusion, with slight caudal migration and with associated osteophyte ridge. Mild facet arthrosis. Evaluation is limited at this level due to motion degradation. However, the disc protrusion appears to result in significant left subarticular stenosis, encroaching upon descending left-sided nerve roots (most notably the descending left S1 nerve root). Correlate for left S1 radiculopathy. Mild right subarticular and central canal narrowing. Moderate bilateral neural foraminal narrowing.   At L4-L5, there is multifactorial mild bilateral subarticular narrowing without appreciable nerve root impingement. Mild bilateral neural foraminal narrowing.  Electronically Signed   By: Kellie Simmering DO   On: 09/12/2020 13:47    PMFS History: Patient Active Problem List   Diagnosis Date Noted   Protrusion of lumbar intervertebral disc 02/16/2021   Low ferritin 02/13/2021   Polyarthralgia 09/09/2020   Sciatica 09/06/2020   Crohn's disease of colon with complication ([place]) 72/01/4708   Chronic post-traumatic stress disorder (PTSD) 04/23/2020   History of seizure 04/23/2020   Traumatic brain injury with loss of consciousness ([place]) 04/23/2020   Essential (primary) hypertension 10/31/2019   Elevated TSH 10/20/2019    Simple febrile convulsions ([place]) 10/20/2019   Abdominal pain, acute, generalized 03/21/2017   Past Medical History:  Diagnosis Date   Acute kidney injury ([place])    Conversion disorder    Crohn's disease ([place])    PTSD (post-traumatic stress disorder)    Seizures ([place])    TBI (traumatic brain injury) ([place])     Family History  Problem Relation Age of Onset   Scoliosis Mother    Other Sister        EDS   Ehlers-Danlos syndrome Sister    Cancer Maternal Grandmother    Scoliosis Maternal Grandmother     Past Surgical History:  Procedure Laterality Date   COLONOSCOPY     Has had 3 done   TONSILECTOMY, ADENOIDECTOMY, BILATERAL MYRINGOTOMY AND TUBES     UPPER GI ENDOSCOPY     Has had 2 of these done   Social History   Occupational History   Not on file  Tobacco Use   Smoking status: Every Day    Packs/day: 0.25    Types: Cigarettes   Smokeless tobacco: Never  Vaping Use   Vaping Use: Never used  Substance and Sexual Activity   Alcohol use: Never   Drug use: Never   Sexual activity: Not Currently

## 2021-02-22 ENCOUNTER — Inpatient Hospital Stay (HOSPITAL_COMMUNITY): Payer: Medicaid Other

## 2021-02-22 ENCOUNTER — Other Ambulatory Visit: Payer: Self-pay

## 2021-02-22 VITALS — BP 116/70 | HR 65 | Temp 97.1°F | Resp 18

## 2021-02-22 DIAGNOSIS — R790 Abnormal level of blood mineral: Secondary | ICD-10-CM

## 2021-02-22 DIAGNOSIS — F4310 Post-traumatic stress disorder, unspecified: Secondary | ICD-10-CM | POA: Diagnosis not present

## 2021-02-22 DIAGNOSIS — Z8349 Family history of other endocrine, nutritional and metabolic diseases: Secondary | ICD-10-CM | POA: Diagnosis not present

## 2021-02-22 DIAGNOSIS — F419 Anxiety disorder, unspecified: Secondary | ICD-10-CM | POA: Diagnosis not present

## 2021-02-22 DIAGNOSIS — Z833 Family history of diabetes mellitus: Secondary | ICD-10-CM | POA: Diagnosis not present

## 2021-02-22 DIAGNOSIS — I10 Essential (primary) hypertension: Secondary | ICD-10-CM | POA: Diagnosis not present

## 2021-02-22 DIAGNOSIS — F17210 Nicotine dependence, cigarettes, uncomplicated: Secondary | ICD-10-CM | POA: Diagnosis not present

## 2021-02-22 DIAGNOSIS — R561 Post traumatic seizures: Secondary | ICD-10-CM | POA: Diagnosis not present

## 2021-02-22 DIAGNOSIS — D649 Anemia, unspecified: Secondary | ICD-10-CM | POA: Diagnosis not present

## 2021-02-22 DIAGNOSIS — K589 Irritable bowel syndrome without diarrhea: Secondary | ICD-10-CM | POA: Diagnosis not present

## 2021-02-22 MED ORDER — LORATADINE 10 MG PO TABS
10.0000 mg | ORAL_TABLET | Freq: Once | ORAL | Status: AC
  Administered 2021-02-22: 10 mg via ORAL
  Filled 2021-02-22: qty 1

## 2021-02-22 MED ORDER — SODIUM CHLORIDE 0.9 % IV SOLN
510.0000 mg | Freq: Once | INTRAVENOUS | Status: AC
  Administered 2021-02-22: 510 mg via INTRAVENOUS
  Filled 2021-02-22: qty 510

## 2021-02-22 MED ORDER — ACETAMINOPHEN 325 MG PO TABS
650.0000 mg | ORAL_TABLET | Freq: Once | ORAL | Status: AC
  Administered 2021-02-22: 650 mg via ORAL
  Filled 2021-02-22: qty 2

## 2021-02-22 MED ORDER — SODIUM CHLORIDE 0.9 % IV SOLN: Freq: Once | INTRAVENOUS | Status: AC

## 2021-02-22 NOTE — Patient Instructions (Signed)
Gregg Gibson  Discharge Instructions: Thank you for choosing [facility] to provide your oncology and hematology care.  If you have a lab appointment with the [facility], please come in thru the Main Entrance and check in at the main information desk.  Wear comfortable clothing and clothing appropriate for easy access to any Portacath or PICC line.   We strive to give you quality time with your provider. You may need to reschedule your appointment if you arrive late (15 or more minutes).  Arriving late affects you and other patients whose appointments are after yours.  Also, if you miss three or more appointments without notifying the office, you may be dismissed from the clinic at the provider's discretion.      For prescription refill requests, have your pharmacy contact our office and allow 72 hours for refills to be completed.    Today you received the following chemotherapy and/or immunotherapy agents Feraheme      To help prevent nausea and vomiting after your treatment, we encourage you to take your nausea medication as directed.  BELOW ARE SYMPTOMS THAT SHOULD BE REPORTED IMMEDIATELY: *FEVER GREATER THAN 100.4 F (38 C) OR HIGHER *CHILLS OR SWEATING *NAUSEA AND VOMITING THAT IS NOT CONTROLLED WITH YOUR NAUSEA MEDICATION *UNUSUAL SHORTNESS OF BREATH *UNUSUAL BRUISING OR BLEEDING *URINARY PROBLEMS (pain or burning when urinating, or frequent urination) *BOWEL PROBLEMS (unusual diarrhea, constipation, pain near the anus) TENDERNESS IN MOUTH AND THROAT WITH OR WITHOUT PRESENCE OF ULCERS (sore throat, sores in mouth, or a toothache) UNUSUAL RASH, SWELLING OR PAIN  UNUSUAL VAGINAL DISCHARGE OR ITCHING   Items with * indicate a potential emergency and should be followed up as soon as possible or go to the Emergency Department if any problems should occur.  Please show the CHEMOTHERAPY ALERT CARD or IMMUNOTHERAPY ALERT CARD at check-in to the Emergency  Department and triage nurse.  Should you have questions after your visit or need to cancel or reschedule your appointment, please contact [facility] [phone]  and follow the prompts.  Office hours are 8:00 a.m. to 4:30 p.m. Monday - Friday. Please note that voicemails left after 4:00 p.m. may not be returned until the following business day.  We are closed weekends and major holidays. You have access to a nurse at all times for urgent questions. Please call the main number to the clinic [phone] and follow the prompts.  For any non-urgent questions, you may also contact your provider using MyChart. We now offer e-Visits for anyone 27 and older to request care online for non-urgent symptoms. For details visit [email or web address].   Also download the MyChart app! Go to the app store, search "MyChart", open the app, select [facility], and log in with your MyChart username and password.  Due to Covid, a mask is required upon entering the hospital/clinic. If you do not have a mask, one will be given to you upon arrival. For doctor visits, patients may have 1 support person aged 102 or older with them. For treatment visits, patients cannot have anyone with them due to current Covid guidelines and our immunocompromised population.

## 2021-02-22 NOTE — Progress Notes (Signed)
Patient presents today for Feraheme infusion per providers order.  Vital signs stable.  Patient has no new complaints at this time.    Peripheral IV started and blood return noted pre and post infusion.  Feraheme infusion  given today per MD orders.  Stable during infusion without adverse affects.  Vital signs stable.  No complaints at this time.  Discharge from clinic ambulatory in stable condition.  Alert and oriented X 3.  Follow up with [facility] as scheduled.

## 2021-02-23 ENCOUNTER — Other Ambulatory Visit: Payer: Self-pay | Admitting: Genetic Counselor

## 2021-02-23 ENCOUNTER — Inpatient Hospital Stay: Payer: Medicaid Other

## 2021-02-23 ENCOUNTER — Encounter: Payer: Self-pay | Admitting: Genetic Counselor

## 2021-02-23 ENCOUNTER — Inpatient Hospital Stay: Payer: Medicaid Other | Attending: Genetic Counselor | Admitting: Genetic Counselor

## 2021-02-23 DIAGNOSIS — Z808 Family history of malignant neoplasm of other organs or systems: Secondary | ICD-10-CM | POA: Diagnosis not present

## 2021-02-23 DIAGNOSIS — Z1379 Encounter for other screening for genetic and chromosomal anomalies: Secondary | ICD-10-CM

## 2021-02-23 DIAGNOSIS — Z8489 Family history of other specified conditions: Secondary | ICD-10-CM | POA: Diagnosis not present

## 2021-02-23 LAB — GENETIC SCREENING ORDER

## 2021-02-23 NOTE — Progress Notes (Signed)
REFERRING PROVIDER: Harriett Rush, PA-C [place],  [address]  PRIMARY PROVIDER:  Bary Castilla, NP  PRIMARY REASON FOR VISIT:  Family history of paraganglioma in maternal grandmother  HISTORY OF PRESENT ILLNESS:   Gregg Gibson, a 20 y.o. male, was seen for a [facility] cancer genetics consultation at the request of Tarri Abernethy, [place] due to a family history of paraganglioma.  Gregg Gibson presents to clinic today to discuss the possibility of a hereditary predisposition to cancer, to discuss genetic testing, and to further clarify his future cancer risks, as well as potential cancer risks for family members.   Gregg Gibson is a 20 y.o. male with no personal history of cancer.    CANCER HISTORY:  Oncology History   No history exists.    Past Medical History:  Diagnosis Date   Acute kidney injury ([place])    Conversion disorder    Crohn's disease ([place])    PTSD (post-traumatic stress disorder)    Seizures ([place])    TBI (traumatic brain injury) ([place])     Past Surgical History:  Procedure Laterality Date   COLONOSCOPY     Has had 3 done   TONSILECTOMY, ADENOIDECTOMY, BILATERAL MYRINGOTOMY AND TUBES     UPPER GI ENDOSCOPY     Has had 2 of these done    Social History   Socioeconomic History   Marital status: Soil scientist    Spouse name: Not on file   Number of children: Not on file   Years of education: Not on file   Highest education level: Not on file  Occupational History   Not on file  Tobacco Use   Smoking status: Every Day    Packs/day: 0.25    Types: Cigarettes   Smokeless tobacco: Never  Vaping Use   Vaping Use: Never used  Substance and Sexual Activity   Alcohol use: Never   Drug use: Never   Sexual activity: Not Currently  Other Topics Concern   Not on file  Social History Narrative   Lives with boyfriend.     Social Determinants of Health   Financial Resource Strain: Not on file  Food Insecurity: Not on  file  Transportation Needs: No Transportation Needs   Lack of Transportation (Medical): No   Lack of Transportation (Non-Medical): No  Physical Activity: Inactive   Days of Exercise per Week: 0 days   Minutes of Exercise per Session: 0 min  Stress: Not on file  Social Connections: Not on file     FAMILY HISTORY:  We obtained a detailed, 4-generation family history.  Significant diagnoses are listed below:  Family History  Problem Relation Age of Onset   Other Maternal Grandmother        paraganglioma     Gregg Gibson has limited information about his maternal family history and no information about his paternal family history because he was adopted. He reports that his maternal grandmother was diagnosed with a paraganglioma in her 29s. Gregg Gibson is unaware of previous family history of genetic testing for hereditary cancer risks.   GENETIC COUNSELING ASSESSMENT: Gregg Gibson is a 20 y.o. male with a family history of paraganglioma which is somewhat suggestive of a predisposition to hereditary paraganglioma and pheochromocytoma syndrome. We, therefore, discussed and recommended the following at today's visit.   DISCUSSION: We discussed that 5 - 10% of cancer is hereditary and some cases of paragangliomas are due to hereditary paraganglioma pheochromocytoma syndrome.  We discussed that  testing is beneficial for several reasons, including knowing about other tumor/cancer risks, identifying potential screening and risk-reduction options that may be appropriate, and to understanding if other family members could be at risk for tumors/cancer and allowing them to undergo genetic testing.  We reviewed the characteristics, features and inheritance patterns of hereditary cancer/paraganglioma syndromes. We also discussed genetic testing, including the appropriate family members to test, the process of testing, insurance coverage and turn-around-time for results. We discussed the implications of a negative,  positive, carrier and/or variant of uncertain significant result. We discussed that negative results would be uninformative given that Gregg Gibson does not have a personal history of cancer/paragangliomas. We reviewed that his maternal grandmother would be the ideal individual to test. However, she does not live in [place] and Gregg Gibson desired to pursue genetic testing himself. Additionally, he does not have any information on his paternal family medical history.   Gregg Gibson was offered a common hereditary cancer panel (47 genes) and an expanded pan-cancer panel (84 genes). Gregg Gibson was informed of the benefits and limitations of each panel, including that expanded pan-cancer panels contain several genes that do not have clear management guidelines at this point in time.  We also discussed that as the number of genes included on a panel increases, the chances of variants of uncertain significance increases.  After considering the benefits and limitations of each gene panel, Gregg Gibson elected to have [facility]'s Multi-Cancer Panel+RNA (84 genes).  Based on Gregg Gibson family history, he does not meet medical criteria for genetic testing. He may have an out of pocket cost. We discussed that the lab will reach out to him by phone and/or email to discuss billing.  We discussed that some people do not want to undergo genetic testing due to fear of genetic discrimination.  A federal law called the Genetic Information Non-Discrimination Act (GINA) of 2008 helps protect individuals against genetic discrimination based on their genetic test results.  It impacts both health insurance and employment.  With health insurance, it protects against increased premiums, being kicked off insurance or being forced to take a test in order to be insured.  For employment it protects against hiring, firing and promoting decisions based on genetic test results.  GINA does not apply to those in the [employer], those who work for companies  with less than 15 employees, and new life insurance or long-term disability insurance policies.  Health status due to a cancer diagnosis is not protected under GINA.  PLAN: After considering the risks, benefits, and limitations, Gregg Gibson provided informed consent to pursue genetic testing and the blood sample was sent to [facility] for analysis of the Multi-Cancer Panel+RNA (84 genes). Results should be available within approximately 2-3 weeks' time, at which point they will be disclosed by telephone to Gregg Gibson, as will any additional recommendations warranted by these results. Gregg Gibson will receive a summary of his genetic counseling visit and a copy of his results once available. This information will also be available in Epic.   Gregg Gibson questions were answered to his satisfaction today. Our contact information was provided should additional questions or concerns arise. Thank you for the referral and allowing [place] to share in the care of your patient.   Lucille Passy, MS, [facility] Genetic Counselor [email or web address] (P) [phone]  The patient was seen for a total of 35 minutes in face-to-face genetic counseling. The patient was seen alone.  Drs. Magrinat, Lindi Adie and/or Burr Medico were available to discuss  this case as needed.  _______________________________________________________________________ For Office Staff:  Number of people involved in session: 1 Was an Intern/ student involved with case: no

## 2021-02-27 ENCOUNTER — Ambulatory Visit: Payer: Self-pay | Admitting: Urology

## 2021-02-28 ENCOUNTER — Ambulatory Visit (INDEPENDENT_AMBULATORY_CARE_PROVIDER_SITE_OTHER): Payer: Medicaid Other | Admitting: Nurse Practitioner

## 2021-02-28 ENCOUNTER — Encounter: Payer: Self-pay | Admitting: Nurse Practitioner

## 2021-02-28 ENCOUNTER — Other Ambulatory Visit: Payer: Self-pay

## 2021-02-28 VITALS — BP 112/70 | HR 67 | Temp 98.8°F | Ht 68.0 in | Wt 127.2 lb

## 2021-02-28 DIAGNOSIS — N400 Enlarged prostate without lower urinary tract symptoms: Secondary | ICD-10-CM | POA: Diagnosis not present

## 2021-02-28 DIAGNOSIS — J342 Deviated nasal septum: Secondary | ICD-10-CM | POA: Diagnosis not present

## 2021-02-28 NOTE — Progress Notes (Signed)
I,Katawbba Wiggins,acting as a Education administrator for [employer], NP.,have documented all relevant documentation on the behalf of [employer], NP,as directed by  Bary Castilla, NP while in the presence of Bary Castilla, NP.  This visit occurred during the SARS-CoV-2 public health emergency.  Safety protocols were in place, including screening questions prior to the visit, additional usage of staff PPE, and extensive cleaning of exam room while observing appropriate contact time as indicated for disinfecting solutions.  Subjective:     Patient ID: Gregg Gibson , male    DOB: 07/27/2000 , 20 y.o.   MRN: [id number]   Chief Complaint  Patient presents with   Referral    HPI  The patient is here for a referral to ENT. He has a history of deviated septum and he would like to see a ENT specialist for that. He has a history of seizures. He has seizure every night. He wears a back brace. Epson salt baths. He saw a orthopedic and they told him he would have to get some fusion at some point. He is scheduled for psychology in Feb.     Past Medical History:  Diagnosis Date   Acute kidney injury ([place])    Conversion disorder    Crohn's disease ([place])    PTSD (post-traumatic stress disorder)    Seizures ([place])    TBI (traumatic brain injury)      Family History  Problem Relation Age of Onset   Scoliosis Mother    Other Sister        EDS   Ehlers-Danlos syndrome Sister    Other Maternal Grandmother        paraganglioma   Scoliosis Maternal Grandmother      Current Outpatient Medications:    carvedilol (COREG) 25 MG tablet, Take 1 tablet (25 mg total) by mouth in the morning and at bedtime., Disp: 180 tablet, Rfl: 1   ergocalciferol (VITAMIN D2) 1.25 MG (50000 UT) capsule, Take 1 capsule (50,000 Units total) by mouth once a week., Disp: 5 capsule, Rfl: 6   ferrous sulfate 325 (65 FE) MG EC tablet, Take 1 tablet (325 mg total) by mouth 3 (three) times daily with meals., Disp: 30  tablet, Rfl: 11   hydrOXYzine (ATARAX/VISTARIL) 25 MG tablet, Take 1 tablet (25 mg total) by mouth daily as needed., Disp: 30 tablet, Rfl: 2   hyoscyamine (LEVBID) 0.375 MG 12 hr tablet, Take 0.375 mg by mouth 2 (two) times daily., Disp: , Rfl:    lamoTRIgine (LAMICTAL) 100 MG tablet, Take 150 mg by mouth 2 (two) times daily., Disp: , Rfl:    meclizine (ANTIVERT) 25 MG tablet, Take 1 tablet (25 mg total) by mouth 3 (three) times daily as needed for dizziness., Disp: 30 tablet, Rfl: 0   omeprazole (PRILOSEC) 20 MG capsule, Take 40 mg by mouth 2 (two) times daily., Disp: , Rfl:    ondansetron (ZOFRAN-ODT) 4 MG disintegrating tablet, Take 4 mg by mouth 3 (three) times daily., Disp: , Rfl:    prazosin (MINIPRESS) 2 MG capsule, Take 1 capsule (2 mg total) by mouth 2 (two) times daily., Disp: 180 capsule, Rfl: 1   sertraline (ZOLOFT) 100 MG tablet, Take 2 tablets (200 mg total) by mouth daily., Disp: 60 tablet, Rfl: 1   XCOPRI 100 MG TABS, Take by mouth., Disp: , Rfl:    Allergies  Allergen Reactions   Oseltamivir Hives     Review of Systems  Constitutional: Negative.  Negative for chills, fatigue and fever.  HENT:  Positive for nosebleeds. Negative for congestion, postnasal drip, rhinorrhea, sinus pain and sneezing.   Respiratory: Negative.  Negative for cough, shortness of breath and wheezing.   Cardiovascular: Negative.  Negative for chest pain and palpitations.  Gastrointestinal: Negative.   Genitourinary:  Negative for hematuria, penile pain, scrotal swelling and testicular pain.       Reports of enlarged prostate   Neurological:  Positive for dizziness and seizures. Negative for headaches.       Hx of seizures.   Psychiatric/Behavioral: Negative.    All other systems reviewed and are negative.   Today's Vitals   02/28/21 1422  BP: 112/70  Pulse: 67  Temp: 98.8 F (37.1 C)  Weight: 127 lb 3.2 oz (57.7 kg)  Height: 5' 8"  (1.727 m)   Body mass index is 19.34 kg/m.  Wt Readings  from Last 3 Encounters:  02/28/21 127 lb 3.2 oz (57.7 kg)  02/16/21 125 lb (56.7 kg)  02/13/21 128 lb 12 oz (58.4 kg)    BP Readings from Last 3 Encounters:  02/28/21 112/70  02/22/21 116/70  02/15/21 113/74    Objective:  Physical Exam Constitutional:      Appearance: Normal appearance. He is obese.  HENT:     Head: Normocephalic and atraumatic.     Nose: Septal deviation present. No congestion or rhinorrhea.     Right Nostril: No foreign body.     Left Nostril: No foreign body.  Cardiovascular:     Rate and Rhythm: Normal rate and regular rhythm.     Pulses: Normal pulses.     Heart sounds: Normal heart sounds. No murmur heard. Pulmonary:     Effort: Pulmonary effort is normal. No respiratory distress.     Breath sounds: Normal breath sounds. No wheezing.  Skin:    General: Skin is warm and dry.  Neurological:     Mental Status: He is alert.        Assessment And Plan:     1. Enlarged prostate -Reports of a hx of elevated PSA from previous provider. -will request records  - Ambulatory referral to Urology  2. Deviated septum -Due to his hx of seizures patient has had nose bleeds in the past due to his seizures and falls.  - Ambulatory referral to ENT    The patient was encouraged to call or send a message through [place] for any questions or concerns.   Follow up: if symptoms persist or do not get better.   Side effects and appropriate use of all the medication(s) were discussed with the patient today. Patient advised to use the medication(s) as directed by their healthcare provider. The patient was encouraged to read, review, and understand all associated package inserts and contact our office with any questions or concerns. The patient accepts the risks of the treatment plan and had an opportunity to ask questions.   Staying healthy and adopting a healthy lifestyle for your overall health is important. You should eat 7 or more servings of fruits and vegetables  per day. You should drink plenty of water to keep yourself hydrated and your kidneys healthy. This includes about 65-80+ fluid ounces of water. Limit your intake of animal fats especially for elevated cholesterol. Avoid highly processed food and limit your salt intake if you have hypertension. Avoid foods high in saturated/Trans fats. Along with a healthy diet it is also very important to maintain time for yourself to maintain a healthy mental health with low stress levels. You  should get atleast 150 min of moderate intensity exercise weekly for a healthy heart. Along with eating right and exercising, aim for at least 7-9 hours of sleep daily.  Eat more whole grains which includes barley, wheat berries, oats, brown rice and whole wheat pasta. Use healthy plant oils which include olive, soy, corn, sunflower and peanut. Limit your caffeine and sugary drinks. Limit your intake of fast foods. Limit milk and dairy products to one or two daily servings.   Patient was given opportunity to ask questions. Patient verbalized understanding of the plan and was able to repeat key elements of the plan. All questions were answered to their satisfaction.  Raman Mickel Schreur, DNP   I, Raman Marcea Rojek have reviewed all documentation for this visit. The documentation on 02/28/21 for the exam, diagnosis, procedures, and orders are all accurate and complete.    IF YOU HAVE BEEN REFERRED TO A SPECIALIST, IT MAY TAKE 1-2 WEEKS TO SCHEDULE/PROCESS THE REFERRAL. IF YOU HAVE NOT HEARD FROM US/SPECIALIST IN TWO WEEKS, PLEASE GIVE [place] A CALL AT [phone].   THE PATIENT IS ENCOURAGED TO PRACTICE SOCIAL DISTANCING DUE TO THE COVID-19 PANDEMIC.

## 2021-03-01 ENCOUNTER — Encounter (HOSPITAL_COMMUNITY): Payer: Self-pay

## 2021-03-01 ENCOUNTER — Telehealth: Payer: Self-pay

## 2021-03-01 ENCOUNTER — Inpatient Hospital Stay (HOSPITAL_COMMUNITY): Payer: Medicaid Other | Attending: Hematology

## 2021-03-01 ENCOUNTER — Encounter: Payer: Self-pay | Admitting: Physician Assistant

## 2021-03-01 VITALS — BP 103/65 | HR 68 | Temp 96.8°F | Resp 17 | Wt 126.0 lb

## 2021-03-01 DIAGNOSIS — D509 Iron deficiency anemia, unspecified: Secondary | ICD-10-CM | POA: Diagnosis not present

## 2021-03-01 DIAGNOSIS — R790 Abnormal level of blood mineral: Secondary | ICD-10-CM

## 2021-03-01 MED ORDER — LORATADINE 10 MG PO TABS
10.0000 mg | ORAL_TABLET | Freq: Once | ORAL | Status: AC
  Administered 2021-03-01: 10 mg via ORAL
  Filled 2021-03-01: qty 1

## 2021-03-01 MED ORDER — SODIUM CHLORIDE 0.9 % IV SOLN
510.0000 mg | Freq: Once | INTRAVENOUS | Status: AC
  Administered 2021-03-01: 510 mg via INTRAVENOUS
  Filled 2021-03-01: qty 17

## 2021-03-01 MED ORDER — SODIUM CHLORIDE 0.9 % IV SOLN: Freq: Once | INTRAVENOUS | Status: AC

## 2021-03-01 MED ORDER — ACETAMINOPHEN 325 MG PO TABS: 650.0000 mg | ORAL_TABLET | Freq: Once | ORAL | Status: DC

## 2021-03-01 NOTE — Telephone Encounter (Signed)
FYI

## 2021-03-01 NOTE — Patient Instructions (Signed)
[facility]  Discharge Instructions: Thank you for choosing [facility] to provide your oncology and hematology care.  If you have a lab appointment with the [facility], please come in thru the Main Entrance and check in at the main information desk.  Wear comfortable clothing and clothing appropriate for easy access to any Portacath or PICC line.   We strive to give you quality time with your provider. You may need to reschedule your appointment if you arrive late (15 or more minutes).  Arriving late affects you and other patients whose appointments are after yours.  Also, if you miss three or more appointments without notifying the office, you may be dismissed from the clinic at the provider's discretion.      For prescription refill requests, have your pharmacy contact our office and allow 72 hours for refills to be completed.    Today you received the following Feraheme, return as scheduled.   To help prevent nausea and vomiting after your treatment, we encourage you to take your nausea medication as directed.  BELOW ARE SYMPTOMS THAT SHOULD BE REPORTED IMMEDIATELY: *FEVER GREATER THAN 100.4 F (38 C) OR HIGHER *CHILLS OR SWEATING *NAUSEA AND VOMITING THAT IS NOT CONTROLLED WITH YOUR NAUSEA MEDICATION *UNUSUAL SHORTNESS OF BREATH *UNUSUAL BRUISING OR BLEEDING *URINARY PROBLEMS (pain or burning when urinating, or frequent urination) *BOWEL PROBLEMS (unusual diarrhea, constipation, pain near the anus) TENDERNESS IN MOUTH AND THROAT WITH OR WITHOUT PRESENCE OF ULCERS (sore throat, sores in mouth, or a toothache) UNUSUAL RASH, SWELLING OR PAIN  UNUSUAL VAGINAL DISCHARGE OR ITCHING   Items with * indicate a potential emergency and should be followed up as soon as possible or go to the Emergency Department if any problems should occur.  Please show the CHEMOTHERAPY ALERT CARD or IMMUNOTHERAPY ALERT CARD at check-in to the Emergency Department and triage  nurse.  Should you have questions after your visit or need to cancel or reschedule your appointment, please contact [facility] [phone]  and follow the prompts.  Office hours are 8:00 a.m. to 4:30 p.m. Monday - Friday. Please note that voicemails left after 4:00 p.m. may not be returned until the following business day.  We are closed weekends and major holidays. You have access to a nurse at all times for urgent questions. Please call the main number to the clinic [phone] and follow the prompts.  For any non-urgent questions, you may also contact your provider using MyChart. We now offer e-Visits for anyone 80 and older to request care online for non-urgent symptoms. For details visit [email or web address].   Also download the MyChart app! Go to the app store, search "MyChart", open the app, select [facility], and log in with your MyChart username and password.  Due to Covid, a mask is required upon entering the hospital/clinic. If you do not have a mask, one will be given to you upon arrival. For doctor visits, patients may have 1 support person aged 77 or older with them. For treatment visits, patients cannot have anyone with them due to current Covid guidelines and our immunocompromised population.

## 2021-03-01 NOTE — Progress Notes (Signed)
Reviewed medical records from [facility] cardiology.  Patient saw Dr. Rudie Meyer on 10/27/19.  He mentions echocardiogram that showed no structural heart disease, normal LV systolic function, EF 2707%.  No significant ventricular hypertrophy.  Normal diastolic dysfunction.  No valvular heart disease.  In January 2021 he had an exercise treadmill test.  He exercised on a regular Bruce protocol for 11 minutes and achieved 86.7 metabolic equivalents.  His peak heart rate was 181 bpm representing 89% maximum age-predicted heart rate.  Max blood pressure was 171/71.  Patient had no angina and no ischemic ST segment changes.  No arrhythmias.  Patient stopped because of fatigue and leg weakness.  Dr. Rudie Meyer recommended continuing carvedilol 12.5 mg twice daily and reducing his high sodium intake.  And recommended follow-up in 2 years.

## 2021-03-01 NOTE — Progress Notes (Signed)
Patient presents today for Feraheme. Patient reports he took Tylenol this morning, but has not taken Claritin. Patient tolerated iron infusion with no complaints voiced. Peripheral IV site clean and dry with good blood return noted before and after infusion. Band aid applied. Patient requesting to leave five minutes before wait time is finished due to upcoming Doctors appointment. VSS with discharge and left in satisfactory condition with no s/s of distress noted.

## 2021-03-01 NOTE — Telephone Encounter (Addendum)
Called patient to advise records received.----- Message from Warren Lacy, PA-C sent at 03/01/2021  9:22 AM EDT ----- Regarding: Received records Please let patient know I received his records and have reviewed them.  I see that he has had a normal echocardiogram and exercise treadmill test.  I will see him next month as scheduled and can address any concerns then.  Thank you for working so hard in getting your medical records.  Danise Mina

## 2021-03-01 NOTE — Telephone Encounter (Signed)
Patient was returning a call to discuss test results

## 2021-03-05 NOTE — Progress Notes (Deleted)
Office Visit Note  Patient: Gregg Gibson             Date of Birth: June 28, 2000           MRN: [id number]             PCP: Bary Castilla, NP Referring: Ailene Ards, NP Visit Date: 03/06/2021   Subjective:  No chief complaint on file.   History of Present Illness: Gregg Gibson is a 20 y.o. 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 male with history of TBI and seizure disorder here for evaluation of chronic back pain also concern for connective tissue disease with family history of ehlers-danlos syndrome. He has longstanding joint pains and history of popping of joints with regular movements. He has back and shoulder pain but more problematic is low back pain with intermittent radiation down the left leg. He has upcoming MRI scan next week for assessment of this lumbar disease. He also reports knee cap instability that causes pain. Constipation and diarrhea symptoms he had evaluation EGD and colonoscopy that was negative for microscopic colitis or IBD findings. He experiences vertigo symptoms sometimes and has had seizure. Does not have frequent heart palpitations with postural changes. No history of retinal or lens dislocation. Previous TTE has indicated some dilated or hypertrophic cardiac changes but no aortic root dilation or aneurysm. His biological sister was diagnosed with ehlers danlos syndrome and he has concern related to this or associated risks.   No Rheumatology ROS completed.   PMFS History:  Patient Active Problem List   Diagnosis Date Noted   Protrusion of lumbar intervertebral disc 02/16/2021   Low ferritin 02/13/2021   Polyarthralgia 09/09/2020   Sciatica 09/06/2020   Crohn's disease of colon with complication ([place]) 84/66/5993   Chronic post-traumatic stress disorder (PTSD) 04/23/2020   History of seizure 04/23/2020    Traumatic brain injury with loss of consciousness ([place]) 04/23/2020   Essential (primary) hypertension 10/31/2019   Elevated TSH 10/20/2019   Simple febrile convulsions ([place]) 10/20/2019   Abdominal pain, acute, generalized 03/21/2017    Past Medical History:  Diagnosis Date   Acute kidney injury ([place])    Conversion disorder    Crohn's disease ([place])    PTSD (post-traumatic stress disorder)    Seizures ([place])    TBI (traumatic brain injury)     Family History  Problem Relation Age of Onset   Scoliosis Mother    Other Sister        EDS   Ehlers-Danlos syndrome Sister    Other Maternal Grandmother        paraganglioma   Scoliosis Maternal Grandmother    Past Surgical History:  Procedure Laterality Date   COLONOSCOPY     Has had 3 done   TONSILECTOMY, ADENOIDECTOMY, BILATERAL MYRINGOTOMY AND TUBES     UPPER GI ENDOSCOPY     Has had 2 of these done   Social History   Social History Narrative   Lives with boyfriend.     Immunization History  Administered Date(s) Administered   HPV 9-valent 01/19/2015, 04/11/2015, 08/10/2015   Hepatitis A 01/19/2015, 08/10/2015   Influenza Split 03/17/2012   Influenza,inj,Quad PF,6-35 Mos 03/26/2016   Influenza,inj,quad, With Preservative 03/03/2013, 03/22/2014, 04/11/2015   Meningococcal Conjugate 01/19/2015, 12/24/2016   PFIZER(Purple Top)SARS-COV-2 Vaccination 06/28/2020, 07/19/2020   Td 04/30/2020   Tdap 01/09/2012  Objective: Vital Signs: There were no vitals taken for this visit.   Physical Exam   Musculoskeletal Exam: ***  CDAI Exam: CDAI Score: -- Patient Global: --; Provider Global: -- Swollen: --; Tender: -- Joint Exam 03/06/2021   No joint exam has been documented for this visit   There is currently no information documented on the homunculus. Go to the Rheumatology activity and complete the homunculus joint exam.  Investigation: No additional findings.  Imaging: No results found.  Recent Labs: Lab  Results  Component Value Date   WBC 9.0 02/06/2021   HGB 14.9 02/06/2021   PLT 239 02/06/2021   NA 140 01/21/2021   K 3.7 01/21/2021   CL 105 01/21/2021   CO2 20 (L) 01/21/2021   GLUCOSE 117 (H) 01/21/2021   BUN 8 01/21/2021   CREATININE 1.06 01/21/2021   BILITOT 0.4 01/21/2021   ALKPHOS 69 01/21/2021   AST 26 01/21/2021   ALT 14 01/21/2021   PROT 7.2 01/21/2021   ALBUMIN 4.5 01/21/2021   CALCIUM 9.5 01/21/2021   GFRAA 143 07/28/2020    Speciality Comments: No specialty comments available.  Procedures:  No procedures performed Allergies: Oseltamivir   Assessment / Plan:     Visit Diagnoses: No diagnosis found.  ***  Orders: No orders of the defined types were placed in this encounter.  No orders of the defined types were placed in this encounter.    Follow-Up Instructions: No follow-ups on file.   Collier Salina, MD  Note - This record has been created using [employer].  Chart creation errors have been sought, but may not always  have been located. Such creation errors do not reflect on  the standard of medical care.

## 2021-03-06 ENCOUNTER — Ambulatory Visit: Payer: Medicaid Other | Admitting: Internal Medicine

## 2021-03-08 ENCOUNTER — Ambulatory Visit: Payer: Medicaid Other | Admitting: Nurse Practitioner

## 2021-03-08 ENCOUNTER — Encounter: Payer: Self-pay | Admitting: Urology

## 2021-03-09 ENCOUNTER — Ambulatory Visit: Payer: Medicaid Other | Admitting: Urology

## 2021-03-15 ENCOUNTER — Telehealth: Payer: Self-pay | Admitting: Genetic Counselor

## 2021-03-15 NOTE — Telephone Encounter (Signed)
I attempted to contact Mr. Gregg Gibson to discuss his genetic testing results (84 genes). I left a voicemail requesting he call me back at [phone].  Lucille Passy, MS, [facility] Genetic Counselor [email or web address] (P) [phone]

## 2021-03-17 ENCOUNTER — Ambulatory Visit: Payer: Self-pay | Admitting: Genetic Counselor

## 2021-03-17 ENCOUNTER — Encounter: Payer: Self-pay | Admitting: Genetic Counselor

## 2021-03-17 ENCOUNTER — Telehealth: Payer: Self-pay | Admitting: Genetic Counselor

## 2021-03-17 DIAGNOSIS — Z1379 Encounter for other screening for genetic and chromosomal anomalies: Secondary | ICD-10-CM | POA: Insufficient documentation

## 2021-03-17 NOTE — Progress Notes (Signed)
HPI:   Mr. Bain was previously seen in the [place] [facility] clinic due to a family history of a pheochromocytoma and concerns regarding a hereditary predisposition to cancer. Please refer to our prior cancer genetics clinic note for more information regarding our discussion, assessment and recommendations, at the time. Mr. Therien recent genetic test results were disclosed to him, as were recommendations warranted by these results. These results and recommendations are discussed in more detail below.  CANCER HISTORY:  Oncology History   No history exists.    FAMILY HISTORY:  We obtained a detailed, 4-generation family history.  Significant diagnoses are listed below: Family History  Problem Relation Age of Onset   Scoliosis Mother    Other Sister        EDS   Ehlers-Danlos syndrome Sister    Other Maternal Grandmother        paraganglioma   Scoliosis Maternal Grandmother      GENETIC TEST RESULTS:  The [facility] Mutli-Cancer Panel found no pathogenic mutations.   The Multi-Cancer + RNA Panel offered by [facility] includes sequencing and/or deletion/duplication analysis of the following 84 genes:  AIP*, ALK, APC*, ATM*, AXIN2*, BAP1*, BARD1*, BLM*, BMPR1A*, BRCA1*, BRCA2*, BRIP1*, CASR, CDC73*, CDH1*, CDK4, CDKN1B*, CDKN1C*, CDKN2A, CEBPA, CHEK2*, CTNNA1*, DICER1*, DIS3L2*, EGFR, EPCAM, FH*, FLCN*, GATA2*, GPC3, GREM1, HOXB13, HRAS, KIT, MAX*, MEN1*, MET, MITF, MLH1*, MSH2*, MSH3*, MSH6*, MUTYH*, NBN*, NF1*, NF2*, NTHL1*, PALB2*, PDGFRA, PHOX2B, PMS2*, POLD1*, POLE*, POT1*, PRKAR1A*, PTCH1*, PTEN*, RAD50*, RAD51C*, RAD51D*, RB1*, RECQL4, RET, RUNX1*, SDHA*, SDHAF2*, SDHB*, SDHC*, SDHD*, SMAD4*, SMARCA4*, SMARCB1*, SMARCE1*, STK11*, SUFU*, TERC, TERT, TMEM127*, Tp53*, TSC1*, TSC2*, VHL*, WRN*, and WT1.  RNA analysis is performed for * genes.   The test report has been scanned into EPIC and is located under the Molecular Pathology section of the Results Review tab.  A portion of the  result report is included below for reference. Genetic testing reported out on 03/14/2021.       Even though a pathogenic variant was not identified, possible explanations for the cancer in the family may include: There may be no hereditary risk for cancer in the family. The cancers in Mr. Morren family may be due to other genetic or environmental factors. There may be a gene mutation in one of these genes that current testing methods cannot detect, but that chance is small. There could be another gene that has not yet been discovered, or that we have not yet tested, that is responsible for the cancer diagnoses in the family.  It is also possible there is a hereditary cause for the cancer in the family that Mr. Primm did not inherit.  Therefore, it is important to remain in touch with cancer genetics in the future so that we can continue to offer Mr. Scheidt the most up to date genetic testing.   ADDITIONAL GENETIC TESTING:  We discussed with Mr. Carswell that his genetic testing was fairly extensive.  If there are genes identified to increase cancer risk that can be analyzed in the future, we would be happy to discuss and coordinate this testing at that time.    CANCER SCREENING RECOMMENDATIONS:  Mr. Dowson test result is considered negative (normal).  This means that we have not identified a hereditary cause for his family history of a pheochromocytoma at this time.   An individual's cancer risk and medical management are not determined by genetic test results alone. Overall cancer risk assessment incorporates additional factors, including personal medical history, family history, and any  available genetic information that may result in a personalized plan for cancer prevention and surveillance. Therefore, it is recommended he continue to follow the cancer management and screening guidelines provided by his primary healthcare provider.  RECOMMENDATIONS FOR FAMILY MEMBERS:   Other members of the  family may still carry a pathogenic variant in one of these genes that Mr. Mciver did not inherit. Based on the family history, we recommend his maternal grandmother, who was diagnosed with a pheochromocytoma consider genetic counseling and testing. Mr. Canter will let us know if we can be of any assistance in coordinating genetic counseling and/or testing for this family member.    FOLLOW-UP:  Cncer genetics is a rapidly advancing field and it is possible that new genetic tests will be appropriate for him and/or his family members in the future. We encouraged him to remain in contact with cancer genetics on an annual basis so we can update his personal and family histories and let him know of advances in cancer genetics that may benefit this family.   Our contact number was provided. Mr. Pitera questions were answered to his satisfaction, and he knows he is welcome to call us at anytime with additional questions or concerns.   Lucille Passy, MS, [facility] Genetic Counselor [email or web address] (P) [phone]

## 2021-03-17 NOTE — Telephone Encounter (Signed)
I contacted Mr. Roselle to discuss his genetic testing results. No pathogenic variants were identified in the 84 genes analyzed.   The test report has been scanned into EPIC and is located under the Molecular Pathology section of the Results Review tab.  A portion of the result report is included below for reference. Detailed clinic note to follow.  Lucille Passy, MS, [facility] Genetic Counselor [email or web address] (P) [phone]

## 2021-03-20 ENCOUNTER — Encounter (HOSPITAL_COMMUNITY): Payer: Self-pay | Admitting: Hematology

## 2021-03-29 ENCOUNTER — Telehealth: Payer: Self-pay | Admitting: *Deleted

## 2021-03-29 NOTE — Telephone Encounter (Signed)
Left sleep study appointment details on voice mail.

## 2021-04-02 IMAGING — CT CT HEAD W/O CM
3 series · 16 of 47 positions shown, 19 images · non-contrast
Comparison: None.

CLINICAL DATA: Unresponsive, vomiting

EXAM:
CT HEAD WITHOUT CONTRAST
TECHNIQUE: Contiguous axial images were obtained from the base of the skull
through the vertex without intravenous contrast.

[Series 2: head w o · axial · 0.48mm/px · z∈[-642,-507]mm · 10 of 33 slices shown, 13 images]
[im 3/33  brain]
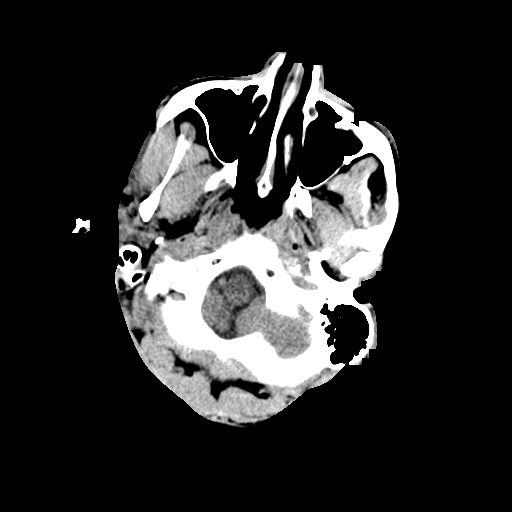
[im 3/33  bone]
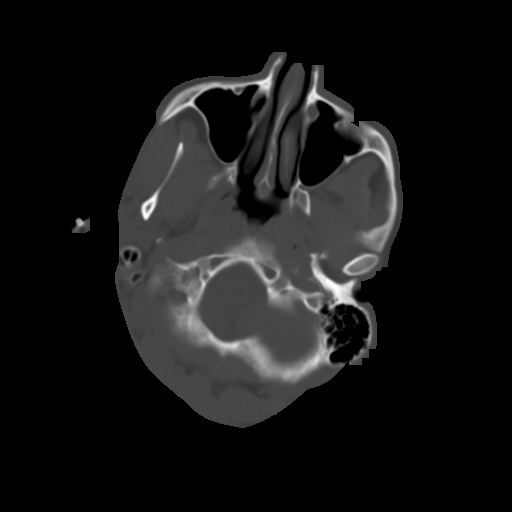
[im 6/33  brain]
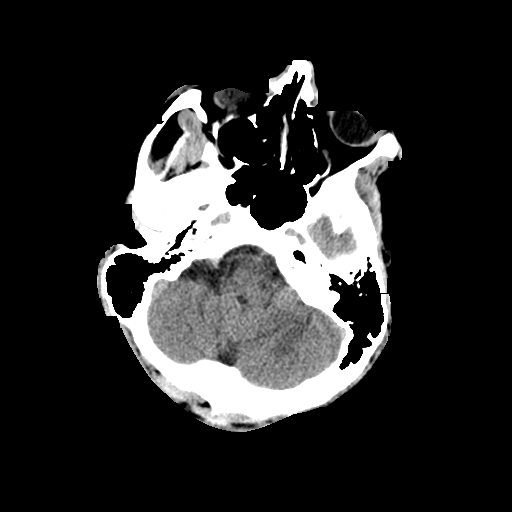
[im 9/33  brain]
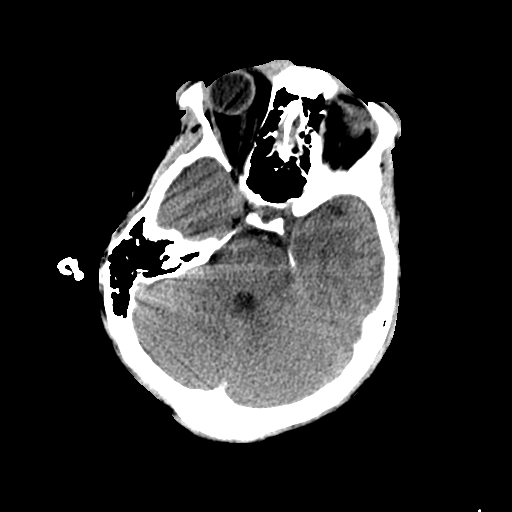
[im 12/33  brain]
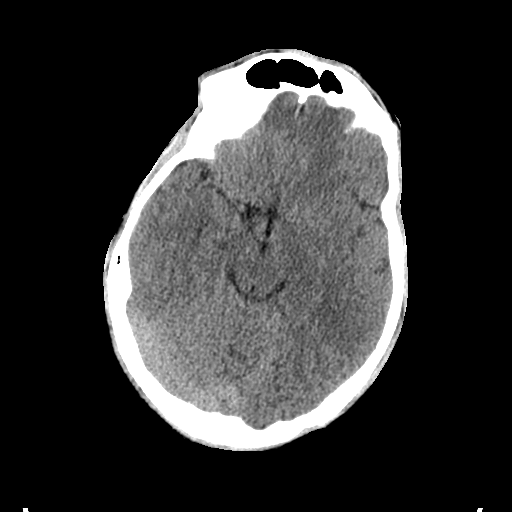
[im 15/33  brain]
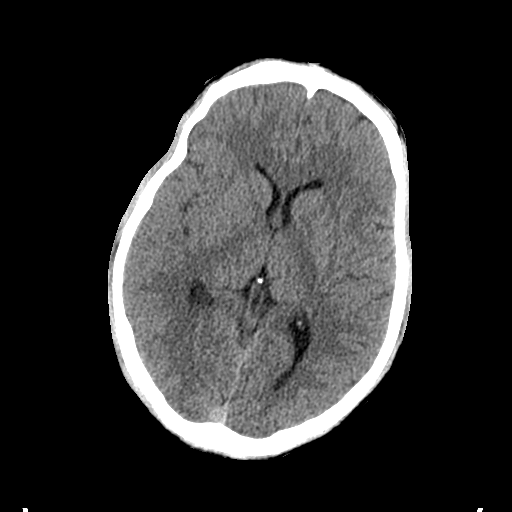
[im 15/33  bone]
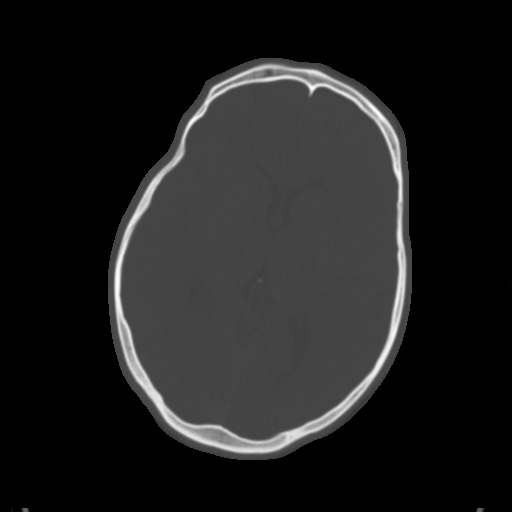
[im 18/33  brain]
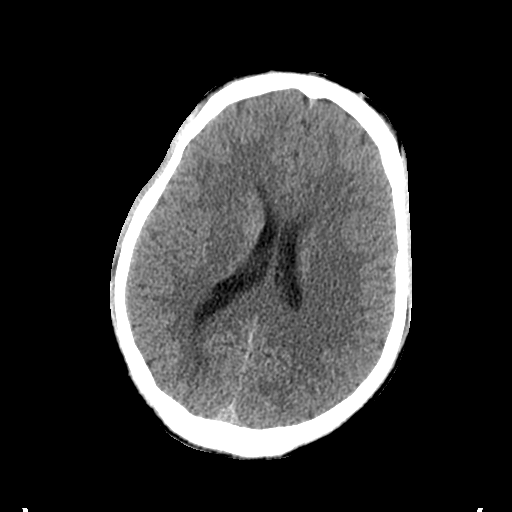
[im 21/33  brain]
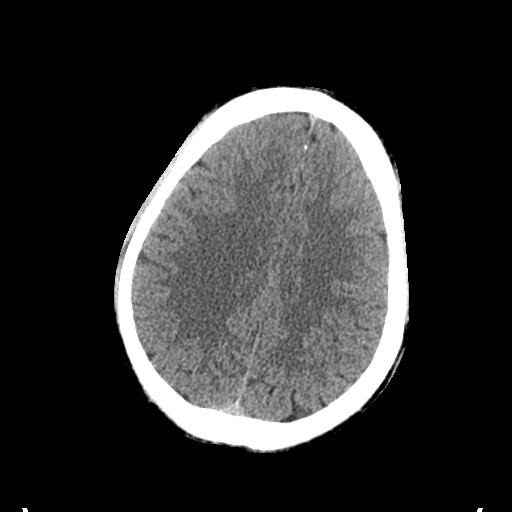
[im 25/33  brain]
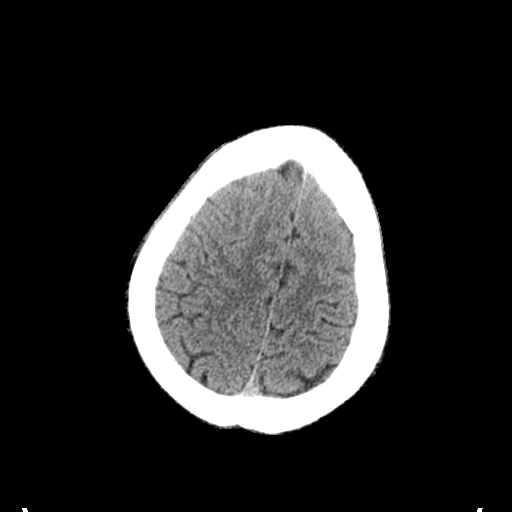
[im 27/33  brain]
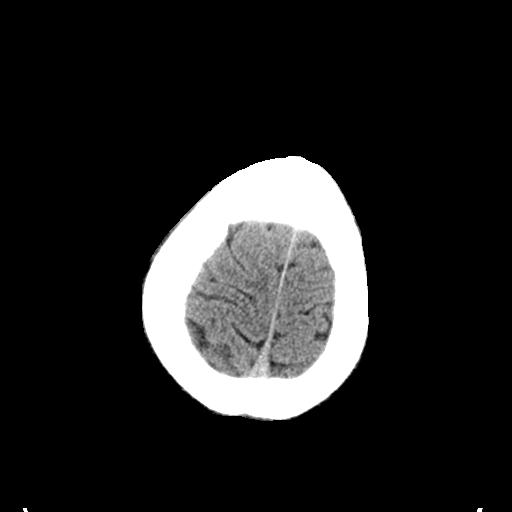
[im 27/33  bone]
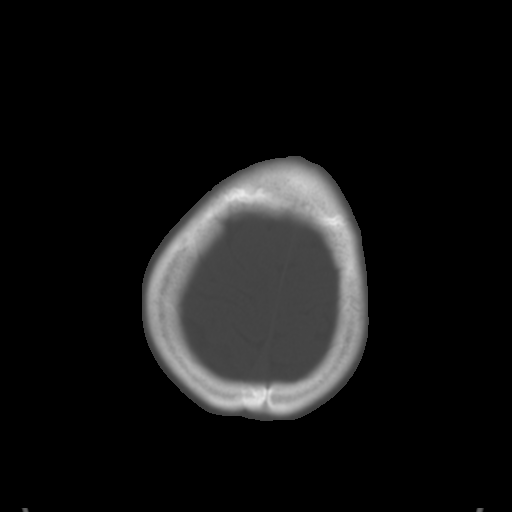
[im 30/33  brain]
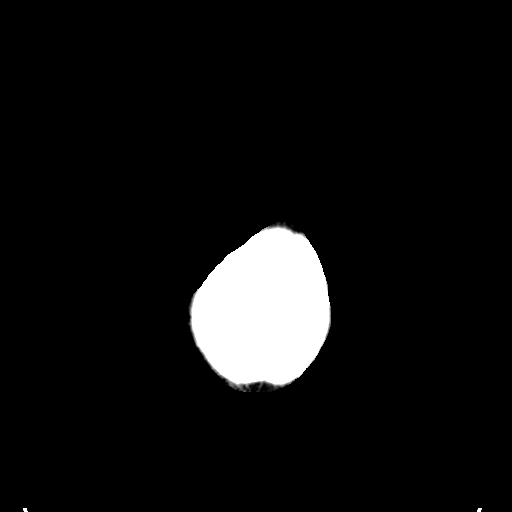

[Series 4: coronal soft · coronal · 0.33mm/px · 3 of 80 slices shown]
[im 27/80  brain]
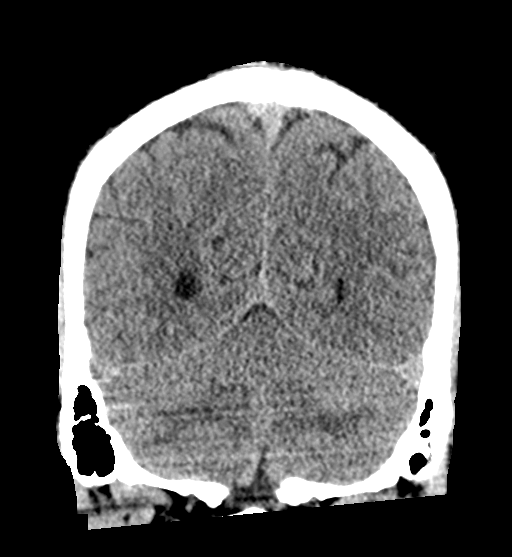
[im 36/80  brain]
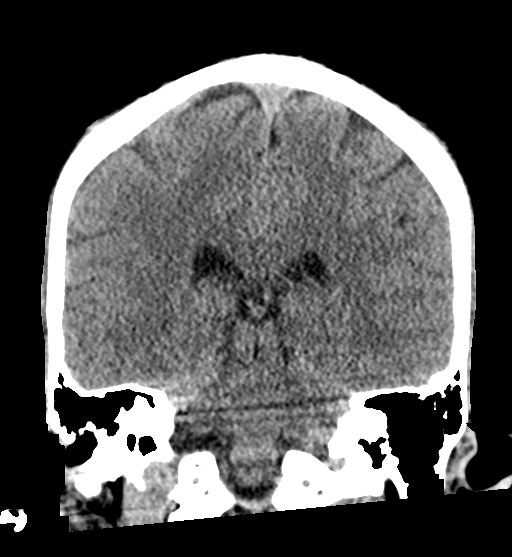
[im 44/80  brain]
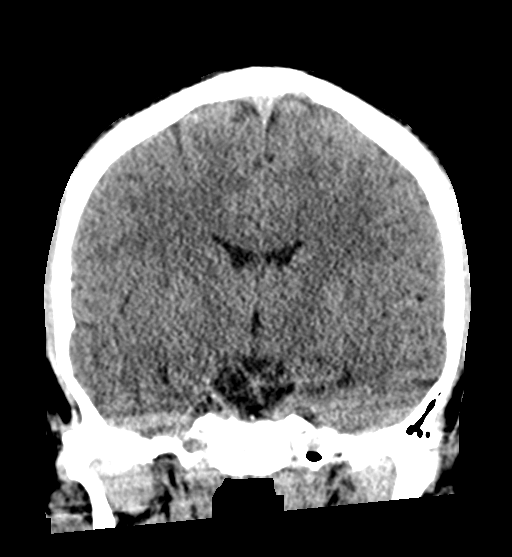

[Series 5: sagittal soft · sagittal · 0.37mm/px · 3 of 62 slices shown]
[im 24/62  brain]
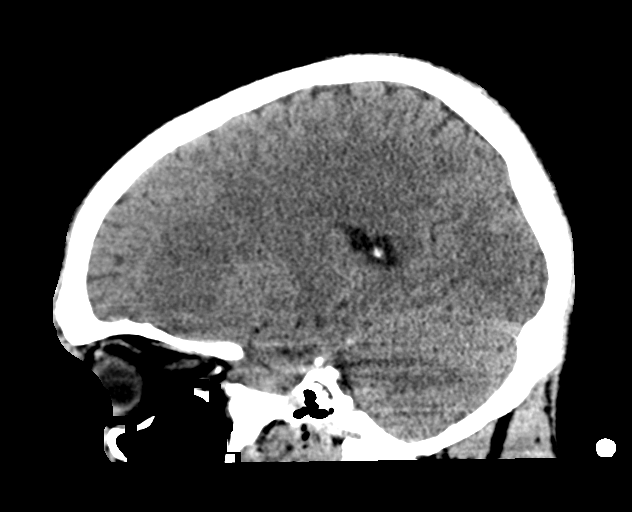
[im 31/62  brain]
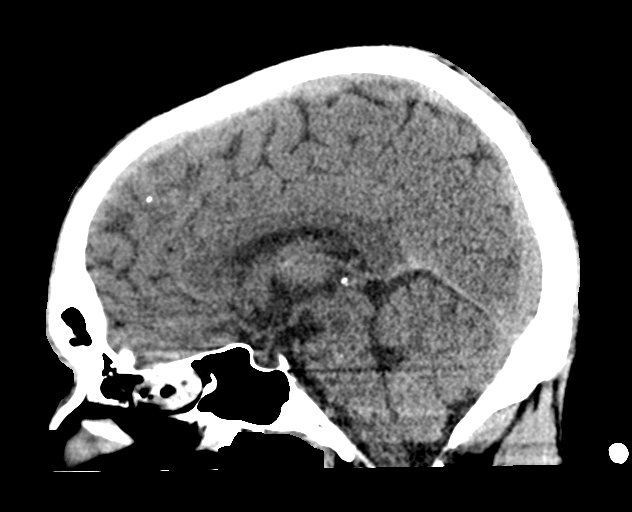
[im 39/62  brain]
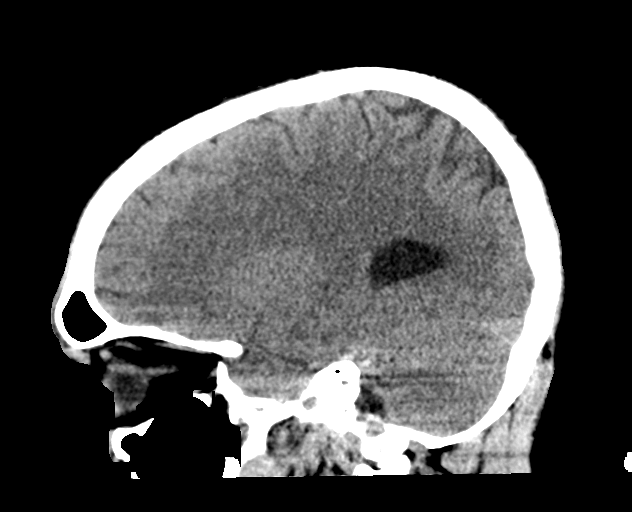

[16 of 47 positions shown; findings below may reference images not displayed]

FINDINGS: Brain: No evidence of acute infarction, hemorrhage, hydrocephalus,
extra-axial collection or mass lesion/mass effect.

Vascular: No hyperdense vessel or unexpected calcification.

Skull: Normal. Negative for fracture or focal lesion.

Sinuses/Orbits: The visualized paranasal sinuses are essentially
clear. The mastoid air cells are unopacified.

Other: None.
IMPRESSION: Normal head CT.

## 2021-04-09 NOTE — Progress Notes (Signed)
Cardiology Office Note:    Date:  04/10/2021   ID:  Gregg Gibson, DOB 2000/08/11, MRN [id number]  PCP:  Kathyrn Lass [facility] Cardiologist: Minus Breeding, MD   Reason for visit: 37-monthfollow-up  History of Present Illness:    Gregg Gibson a 20y.o. male with a family history of Ehlers-Danlos.  He has a history of tobacco use, seizures, dizziness, syncope, palpitations when he stands up.  He saw Dr. HPercival Spanishin November 22, 2020 describes some orthostatic symptoms.  Zio patch worn for 12 days and showed no significant arrhythmias. [facility], normal LV systolic function, EF 619-50%  No significant ventricular hypertrophy.  Normal diastolic dysfunction.  No valvular heart disease.  In January 2021, exercise treadmill test: exercised on a regular Bruce protocol for 11 minutes and achieved 193.2metabolic equivalents.  His peak heart rate was 181 bpm representing 89% maximum age-predicted heart rate.  No angina and no ischemic ST segment changes.  No arrhythmias.    He saw me in August 2022.  Today, he states his throbbing episodes and palpitations are better with Coreg, though he still has episodes. He states he does have a lightheadedness with bending over or standing too quickly.  He does sometimes wear compression stockings.  Since her last visit he has stopped using cigarettes and he is working on decreasing his vape use.  He finally got scheduled for sleep study.  He states he is continued good dietary habits and is having his cholesterol is improved.     Past Medical History:  Diagnosis Date   [place]    Conversion disorder    Crohn's disease ([facility]    PTSD (post-traumatic stress disorder)    Seizures ([facility]    TBI (traumatic brain injury)     Past Surgical History:  Procedure Laterality Date   COLONOSCOPY     Has had 3 done   TONSILECTOMY, ADENOIDECTOMY, BILATERAL MYRINGOTOMY AND TUBES      UPPER GI ENDOSCOPY     Has had 2 of these done    Current Medications: Current Meds  Medication Sig   ergocalciferol (VITAMIN D2) 1.25 MG (50000 UT) capsule Take 1 capsule (50,000 Units total) by mouth once a week.   ferrous sulfate 325 (65 FE) MG EC tablet Take 1 tablet (325 mg total) by mouth 3 (three) times daily with meals.   hydrOXYzine (ATARAX/VISTARIL) 25 MG tablet Take 1 tablet (25 mg total) by mouth daily as needed.   hyoscyamine (LEVBID) 0.375 MG 12 hr tablet Take 0.375 mg by mouth 2 (two) times daily.   lamoTRIgine (LAMICTAL) 100 MG tablet Take 150 mg by mouth 2 (two) times daily.   lamoTRIgine (LAMICTAL) 200 MG tablet SMARTSIG:1 Tablet(s) By Mouth Morning-Night   meclizine (ANTIVERT) 25 MG tablet Take 1 tablet (25 mg total) by mouth 3 (three) times daily as needed for dizziness.   omeprazole (PRILOSEC) 20 MG capsule Take 40 mg by mouth 2 (two) times daily.   ondansetron (ZOFRAN-ODT) 4 MG disintegrating tablet Take 4 mg by mouth 3 (three) times daily.   prazosin (MINIPRESS) 2 MG capsule Take 1 capsule (2 mg total) by mouth 2 (two) times daily.   sertraline (ZOLOFT) 100 MG tablet Take 2 tablets (200 mg total) by mouth daily.   [DISCONTINUED] carvedilol (COREG) 25 MG tablet Take 1 tablet (25 mg total) by mouth in the morning and at bedtime.  Allergies:   Oseltamivir   Social History   Socioeconomic History   Marital status: Single    Spouse name: Not on file   Number of children: Not on file   Years of education: Not on file   Highest education level: Not on file  Occupational History   Not on file  Tobacco Use   Smoking status: Every Day    Packs/day: 0.25    Types: Cigarettes   Smokeless tobacco: Never  Vaping Use   Vaping Use: Never used  Substance and Sexual Activity   Alcohol use: Never   Drug use: Never   Sexual activity: Not Currently  Other Topics Concern   Not on file  Social History Narrative   Lives with boyfriend.     Social Determinants of  Health   Financial Resource Strain: Not on file  Food Insecurity: Not on file  Transportation Needs: No Transportation Needs   Lack of Transportation (Medical): No   Lack of Transportation (Non-Medical): No  Physical Activity: Inactive   Days of Exercise per Week: 0 days   Minutes of Exercise per Session: 0 min  Stress: Not on file  Social Connections: Not on file     Family History: The patient's family history includes Ehlers-Danlos syndrome in his sister; Other in his maternal grandmother and sister; Scoliosis in his maternal grandmother and mother.  ROS:   Please see the history of present illness.     EKGs/Labs/Other Studies Reviewed:    Recent Labs: 07/28/2020: TSH 3.48 01/21/2021: ALT 14; BUN 8; Creatinine, Ser 1.06; Potassium 3.7; Sodium 140 02/06/2021: Hemoglobin 14.9; Platelets 239   Recent Lipid Panel Lab Results  Component Value Date/Time   CHOL 212 (H) 07/28/2020 03:34 PM   TRIG 85 07/28/2020 03:34 PM   HDL 33 (L) 07/28/2020 03:34 PM   LDLCALC 160 (H) 07/28/2020 03:34 PM    Physical Exam:    VS:  BP 124/82   Pulse 79   Ht 5' 8"  (1.727 m)   Wt 125 lb 12.8 oz (57.1 kg)   SpO2 96%   BMI 19.13 kg/m    No data found.  Wt Readings from Last 3 Encounters:  04/10/21 125 lb 12.8 oz (57.1 kg)  03/01/21 126 lb (57.2 kg)  02/28/21 127 lb 3.2 oz (57.7 kg)     GEN:  Well nourished, well developed in no acute distress HEENT: Normal NECK: No JVD; No carotid bruits CARDIAC: RRR, no murmurs, rubs, gallops RESPIRATORY:  Clear to auscultation without rales, wheezing or rhonchi  ABDOMEN: Soft, non-tender, non-distended MUSCULOSKELETAL: No edema; No deformity  SKIN: Warm and dry NEUROLOGIC:  Alert and oriented PSYCHIATRIC:  Normal affect     ASSESSMENT AND PLAN   Palpitations Hx of syncope - Zio patch 11/2020 without arrhythmia - Echo 2021 at [facility]: normal EF, no structural disease - [facility] test 2021 at [facility]: no ischemia, no  arrhythmias - BP & HR good.   - Continue Coreg. - For his palpitations, no evidence to recommend additional AV nodal blocking agents such as Cardizem.  He would better benefit from complete tobacco cessation to reduce palpitations.  - Recommend compression stockings.   Suspected sleep apnea - STOP-BANG score = 5 this year  - Sleep study scheduled in December - Neurology has also discussed week long EEG to measure seizure burden at night.   Hyperlipidemia - LDL 160 in March 2022. - Recommend checking fasting lipids prior to next visit since he has made dietary changes. -  Discussed cholesterol lowering diets - Mediterranean diet, DASH diet, vegetarian diet, low-carbohydrate diet and avoidance of trans fats.  Discussed healthier choice substitutes.  Nuts, high-fiber foods, and fiber supplements may also improve lipids.     Tobacco use  - Recommend tobacco cessation.  Decrease vaping.  Reviewed physiologic effects of nicotine and the immediate-eventual benefits of quitting including improvement in cough/breathing and reduction in cardiovascular events.  Discussed quitting tips such as removing triggers and getting support from family/friends and [employer] [place].     Disposition: Follow-up with Dr. Percival Spanish in 6 months.        Medication Adjustments/Labs and Tests Ordered: Current medicines are reviewed at length with the patient today.  Concerns regarding medicines are outlined above.  Orders Placed This Encounter  Procedures   Lipid panel   Meds ordered this encounter  Medications   carvedilol (COREG) 25 MG tablet    Sig: Take 1 tablet (25 mg total) by mouth in the morning and at bedtime.    Dispense:  180 tablet    Refill:  2    Patient Instructions  Medication Instructions:  No Changes *If you need a refill on your cardiac medications before your next appointment, please call your pharmacy*   Lab Work: Lipids : to Be Done prior to f/u visit. If you have labs (blood work)  drawn today and your tests are completely normal, you will receive your results only by: [place] (if you have MyChart) OR A paper copy in the mail If you have any lab test that is abnormal or we need to change your treatment, we will call you to review the results.   Testing/Procedures: No Testing   Follow-Up: At [facility], you and your health needs are our priority.  As part of our continuing mission to provide you with exceptional heart care, we have created designated Provider Care Teams.  These Care Teams include your primary Cardiologist (physician) and Advanced Practice Providers (APPs -  Physician Assistants and Nurse Practitioners) who all work together to provide you with the care you need, when you need it.  We recommend signing up for the patient portal called "MyChart".  Sign up information is provided on this After Visit Summary.  MyChart is used to connect with patients for Virtual Visits (Telemedicine).  Patients are able to view lab/test results, encounter notes, upcoming appointments, etc.  Non-urgent messages can be sent to your provider as well.   To learn more about what you can do with MyChart, go to [email or web address].    Your next appointment:   6 month(s)  The format for your next appointment:   In Person  Provider:   Minus Breeding, MD     Other Instructions Continue to Decrease Vapping Use.    Signed, Warren Lacy, PA-C  04/10/2021 10:26 AM    [place]

## 2021-04-10 ENCOUNTER — Ambulatory Visit (INDEPENDENT_AMBULATORY_CARE_PROVIDER_SITE_OTHER): Payer: Medicaid Other | Admitting: Physician Assistant

## 2021-04-10 ENCOUNTER — Encounter: Payer: Self-pay | Admitting: Physician Assistant

## 2021-04-10 ENCOUNTER — Other Ambulatory Visit: Payer: Self-pay

## 2021-04-10 VITALS — BP 124/82 | HR 79 | Ht 68.0 in | Wt 125.8 lb

## 2021-04-10 DIAGNOSIS — E782 Mixed hyperlipidemia: Secondary | ICD-10-CM | POA: Diagnosis not present

## 2021-04-10 DIAGNOSIS — I10 Essential (primary) hypertension: Secondary | ICD-10-CM

## 2021-04-10 DIAGNOSIS — R55 Syncope and collapse: Secondary | ICD-10-CM | POA: Diagnosis not present

## 2021-04-10 DIAGNOSIS — Z720 Tobacco use: Secondary | ICD-10-CM | POA: Diagnosis not present

## 2021-04-10 DIAGNOSIS — R002 Palpitations: Secondary | ICD-10-CM | POA: Diagnosis not present

## 2021-04-10 MED ORDER — CARVEDILOL 25 MG PO TABS: 25.0000 mg | ORAL_TABLET | Freq: Two times a day (BID) | ORAL | 2 refills | Status: AC

## 2021-04-10 NOTE — Patient Instructions (Signed)
Medication Instructions:  No Changes *If you need a refill on your cardiac medications before your next appointment, please call your pharmacy*   Lab Work: Lipids : to Be Done prior to f/u visit. If you have labs (blood work) drawn today and your tests are completely normal, you will receive your results only by: [place] (if you have MyChart) OR A paper copy in the mail If you have any lab test that is abnormal or we need to change your treatment, we will call you to review the results.   Testing/Procedures: No Testing   Follow-Up: At [facility], you and your health needs are our priority.  As part of our continuing mission to provide you with exceptional heart care, we have created designated Provider Care Teams.  These Care Teams include your primary Cardiologist (physician) and Advanced Practice Providers (APPs -  Physician Assistants and Nurse Practitioners) who all work together to provide you with the care you need, when you need it.  We recommend signing up for the patient portal called "MyChart".  Sign up information is provided on this After Visit Summary.  MyChart is used to connect with patients for Virtual Visits (Telemedicine).  Patients are able to view lab/test results, encounter notes, upcoming appointments, etc.  Non-urgent messages can be sent to your provider as well.   To learn more about what you can do with MyChart, go to [email or web address].    Your next appointment:   6 month(s)  The format for your next appointment:   In Person  Provider:   Minus Breeding, MD     Other Instructions Continue to Decrease Vapping Use.

## 2021-04-13 ENCOUNTER — Encounter: Payer: Self-pay | Admitting: Orthopaedic Surgery

## 2021-04-13 ENCOUNTER — Other Ambulatory Visit: Payer: Self-pay

## 2021-04-13 ENCOUNTER — Ambulatory Visit (INDEPENDENT_AMBULATORY_CARE_PROVIDER_SITE_OTHER): Payer: Medicaid Other | Admitting: Orthopaedic Surgery

## 2021-04-13 ENCOUNTER — Ambulatory Visit (INDEPENDENT_AMBULATORY_CARE_PROVIDER_SITE_OTHER): Payer: Medicaid Other

## 2021-04-13 VITALS — Ht 68.0 in | Wt 125.0 lb

## 2021-04-13 DIAGNOSIS — M5126 Other intervertebral disc displacement, lumbar region: Secondary | ICD-10-CM

## 2021-04-13 DIAGNOSIS — M542 Cervicalgia: Secondary | ICD-10-CM | POA: Insufficient documentation

## 2021-04-13 NOTE — Progress Notes (Signed)
Office Visit Note   Patient: Gregg Gibson           Date of Birth: [birth year]           MRN: [id number] Visit Date: 04/13/2021              Requested by: Bary Castilla, NP [address] [place],  [place] [address] PCP: Pcp, No   Assessment & Plan: Visit Diagnoses:  1. Neck pain   2. Protrusion of lumbar intervertebral disc   3. Neck pain on left side     Plan: We reviewed MRI scan lumbar spine for disc protrusion and discussed again pathophysiology for his radicular pain.  He has good strength but does have some nerve root tension signs.  Trochanteric injection is improved his symptoms.  We will set him up for some physical therapy for cervical spine.  Recheck 6 weeks.  Follow-Up Instructions: Return in about 6 weeks (around 05/25/2021).   Orders:  Orders Placed This Encounter  Procedures   XR Cervical Spine 2 or 3 views   No orders of the defined types were placed in this encounter.     Procedures: No procedures performed   Clinical Data: No additional findings.   Subjective: Chief Complaint  Patient presents with   Neck - Pain   Lower Back - Pain, Follow-up    HPI 20 year old male returns he is here with his service dog has PTSD also here with his boyfriend.  Patient has known disc protrusion left L5-S1.  He had a trochanteric injection got improvement.  He is not having problems with neck pain that radiates worse on the left than right with discomfort pain with rotation and activities.  He has restarted his medication for Crohn's disease.  Patient is not working.  Denies fever or chills.  No radicular arm symptoms.  Continued low back pain and radicular left leg pain.  Review of Systems updated unchanged from previous visit.   Objective: Vital Signs: Ht 5' 8"  (1.727 m)   Wt 125 lb (56.7 kg)   BMI 19.01 kg/m   Physical Exam Constitutional:      Appearance: He is well-developed.  HENT:     Head: Normocephalic and atraumatic.      Right Ear: External ear normal.     Left Ear: External ear normal.  Eyes:     Pupils: Pupils are equal, round, and reactive to light.  Neck:     Thyroid: No thyromegaly.     Trachea: No tracheal deviation.  Cardiovascular:     Rate and Rhythm: Normal rate.  Pulmonary:     Effort: Pulmonary effort is normal.     Breath sounds: No wheezing.  Abdominal:     General: Bowel sounds are normal.     Palpations: Abdomen is soft.  Musculoskeletal:     Cervical back: Neck supple.  Skin:    General: Skin is warm and dry.     Capillary Refill: Capillary refill takes less than 2 seconds.  Neurological:     Mental Status: He is alert and oriented to person, place, and time.  Psychiatric:        Behavior: Behavior normal.        Thought Content: Thought content normal.        Judgment: Judgment normal.    Ortho Exam patient has pain with straight leg raising on the left.  He is able to heel and toe walk without weakness.  Mild brachial plexus tenderness  on the left negative Sp[place].  Discomfort with forward flexion chin to chest pain with extension.  Upper extremity reflexes are 2+ and symmetrical biceps triceps brachial radialis.  No impingement of the shoulders elbows reach full extension no upper extremity atrophy.  Specialty Comments:  No specialty comments available.  Imaging: XR Cervical Spine 2 or 3 views  Result Date: 04/13/2021 2 view x-rays cervical spine obtained and reviewed.  This shows mild curvature on AP x-ray.  Normal cervical lordosis.  Disc space height is maintained no anterolisthesis. Impression: Cervical spine radiographs negative for acute changes.    PMFS History: Patient Active Problem List   Diagnosis Date Noted   Neck pain on left side 04/13/2021   Genetic testing 03/17/2021   Protrusion of lumbar intervertebral disc 02/16/2021   Low ferritin 02/13/2021   Polyarthralgia 09/09/2020   Sciatica 09/06/2020   Crohn's disease of colon with complication ([place])  01/08/4817   Chronic post-traumatic stress disorder (PTSD) 04/23/2020   History of seizure 04/23/2020   Traumatic brain injury with loss of consciousness ([place]) 04/23/2020   Essential (primary) hypertension 10/31/2019   Elevated TSH 10/20/2019   Simple febrile convulsions ([place]) 10/20/2019   Abdominal pain, acute, generalized 03/21/2017   Past Medical History:  Diagnosis Date   Acute kidney injury ([place])    Conversion disorder    Crohn's disease ([place])    PTSD (post-traumatic stress disorder)    Seizures ([place])    TBI (traumatic brain injury)     Family History  Problem Relation Age of Onset   Scoliosis Mother    Other Sister        EDS   Ehlers-Danlos syndrome Sister    Other Maternal Grandmother        paraganglioma   Scoliosis Maternal Grandmother     Past Surgical History:  Procedure Laterality Date   COLONOSCOPY     Has had 3 done   TONSILECTOMY, ADENOIDECTOMY, BILATERAL MYRINGOTOMY AND TUBES     UPPER GI ENDOSCOPY     Has had 2 of these done   Social History   Occupational History   Not on file  Tobacco Use   Smoking status: Every Day    Packs/day: 0.25    Types: Cigarettes   Smokeless tobacco: Never  Vaping Use   Vaping Use: Never used  Substance and Sexual Activity   Alcohol use: Never   Drug use: Never   Sexual activity: Not Currently

## 2021-04-19 ENCOUNTER — Other Ambulatory Visit (HOSPITAL_COMMUNITY): Payer: BC Managed Care – PPO

## 2021-04-26 ENCOUNTER — Ambulatory Visit (HOSPITAL_COMMUNITY): Payer: BC Managed Care – PPO | Admitting: Physician Assistant

## 2021-04-27 ENCOUNTER — Ambulatory Visit (HOSPITAL_COMMUNITY): Payer: Medicaid Other | Admitting: Physician Assistant

## 2021-04-27 ENCOUNTER — Encounter: Payer: Medicaid Other | Admitting: Nurse Practitioner

## 2021-04-27 ENCOUNTER — Encounter (HOSPITAL_COMMUNITY): Payer: Self-pay | Admitting: Hematology

## 2021-04-27 DIAGNOSIS — Z419 Encounter for procedure for purposes other than remedying health state, unspecified: Secondary | ICD-10-CM | POA: Diagnosis not present

## 2021-04-28 ENCOUNTER — Encounter (HOSPITAL_BASED_OUTPATIENT_CLINIC_OR_DEPARTMENT_OTHER): Payer: Self-pay | Admitting: Cardiovascular Disease

## 2021-05-01 ENCOUNTER — Other Ambulatory Visit: Payer: Self-pay | Admitting: Nurse Practitioner

## 2021-05-01 DIAGNOSIS — F4310 Post-traumatic stress disorder, unspecified: Secondary | ICD-10-CM

## 2021-05-02 ENCOUNTER — Ambulatory Visit (HOSPITAL_BASED_OUTPATIENT_CLINIC_OR_DEPARTMENT_OTHER): Payer: Medicaid Other | Attending: Physician Assistant | Admitting: Cardiovascular Disease

## 2021-05-03 DIAGNOSIS — N400 Enlarged prostate without lower urinary tract symptoms: Secondary | ICD-10-CM | POA: Diagnosis not present

## 2021-05-03 DIAGNOSIS — R569 Unspecified convulsions: Secondary | ICD-10-CM | POA: Diagnosis not present

## 2021-05-03 DIAGNOSIS — F331 Major depressive disorder, recurrent, moderate: Secondary | ICD-10-CM | POA: Diagnosis not present

## 2021-05-09 ENCOUNTER — Inpatient Hospital Stay (HOSPITAL_COMMUNITY): Payer: Medicaid Other | Attending: Physician Assistant

## 2021-05-10 ENCOUNTER — Telehealth: Payer: Self-pay

## 2021-05-10 NOTE — Telephone Encounter (Signed)
Letter has been sent to patient instructing them to call us if they are still interested in completing their sleep study. If we have not received a response from the patient within 30 days of this notice, the order will be cancelled and they will need to discuss the need for a sleep study at their next office visit.  ° °

## 2021-05-16 ENCOUNTER — Ambulatory Visit (HOSPITAL_COMMUNITY): Payer: Medicaid Other | Admitting: Physician Assistant

## 2021-05-28 DIAGNOSIS — Z419 Encounter for procedure for purposes other than remedying health state, unspecified: Secondary | ICD-10-CM | POA: Diagnosis not present

## 2021-06-21 ENCOUNTER — Other Ambulatory Visit: Payer: Self-pay | Admitting: Nurse Practitioner

## 2021-06-21 DIAGNOSIS — F4310 Post-traumatic stress disorder, unspecified: Secondary | ICD-10-CM

## 2021-06-23 ENCOUNTER — Other Ambulatory Visit: Payer: Self-pay | Admitting: Physician Assistant

## 2021-06-23 DIAGNOSIS — F4310 Post-traumatic stress disorder, unspecified: Secondary | ICD-10-CM

## 2021-06-28 DIAGNOSIS — Z419 Encounter for procedure for purposes other than remedying health state, unspecified: Secondary | ICD-10-CM | POA: Diagnosis not present

## 2021-07-05 DIAGNOSIS — R55 Syncope and collapse: Secondary | ICD-10-CM | POA: Insufficient documentation

## 2021-07-05 NOTE — Progress Notes (Deleted)
Cardiology Office Note   Date:  07/05/2021   ID:  Gregg Gibson, DOB 12-16-2000, MRN [id number]  PCP:  Merryl Hacker, No  Cardiologist:   Minus Breeding, MD Referring:  Ailene Ards, NP  No chief complaint on file.      History of Present Illness: Gregg Gibson is a 21 y.o. male who  is referred by Ailene Ards, NP for evaluation of syncope.  He has a family history of EDD.  He has had an extensive work up for palpitations.   In November 22, 2020 describes some orthostatic symptoms.  Zio patch worn for 12 days and showed no significant arrhythmias. Echo 2021 at [facility] cardiology with no structural heart disease, normal LV systolic function, EF 89-37%.  No significant ventricular hypertrophy.  Normal diastolic dysfunction.  No valvular heart disease.  In January 2021, exercise treadmill test: exercised on a regular Bruce protocol for 11 minutes and achieved 34.2 metabolic equivalents.  His peak heart rate was 181 bpm representing 89% maximum age-predicted heart rate.  No angina and no ischemic ST segment changes and no arrhythmias.   ***     ***     He cancelled her first new patient appt in our office and was a no show for an echocardiogram.  He has a complicated past history and I cannot find notes from her previous cardiologist.  His family has a history of Ehlers-Danlos but he has not had this diagnosis.  He said he has had a history of dizziness and syncope.  He describes palpitations when he stands up predominantly.  He describes some orthostatic symptoms.  He is somewhat vague in the description of his events.  I do see a note from a pediatric cardiologist when he was 21 years old.  He reports that there was a treadmill test a couple of years ago but I do not have these results.  When I did orthostatics he said he not had these before and has never had an echocardiogram.  He says he has had syncope with trauma not infrequently.  He also has seizures.  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  He describes multiple somatic complaints including chest discomfort, pounding in his years.  He has had multiple GI complaints and has seen gastroenterologist over the years.  He reports a diagnosis of ulcerative colitis though his problem list as Crohn's.  He does not report ever being diagnosed with POTS or inappropriate sinus tachycardia.   Past Medical History:  Diagnosis Date   Acute kidney injury ([place])    Conversion disorder    Crohn's disease ([place])    PTSD (post-traumatic stress disorder)    Seizures ([place])    TBI (traumatic brain injury)     Past Surgical History:  Procedure Laterality Date   COLONOSCOPY     Has had 3 done   TONSILECTOMY, ADENOIDECTOMY, BILATERAL MYRINGOTOMY AND TUBES     UPPER GI ENDOSCOPY     Has had 2 of these done     Current Outpatient Medications  Medication Sig Dispense Refill   carvedilol (COREG) 25 MG tablet Take 1 tablet (25 mg total) by mouth in the morning and at bedtime. 180 tablet 2   ergocalciferol (VITAMIN D2) 1.25 MG (50000 UT) capsule Take 1 capsule (50,000 Units total) by mouth once a week. 5 capsule 6   ferrous sulfate 325 (65 FE) MG EC tablet Take 1 tablet (  325 mg total) by mouth 3 (three) times daily with meals. 30 tablet 11   hydrOXYzine (ATARAX/VISTARIL) 25 MG tablet Take 1 tablet (25 mg total) by mouth daily as needed. 30 tablet 2   hyoscyamine (LEVBID) 0.375 MG 12 hr tablet Take 0.375 mg by mouth 2 (two) times daily.     lamoTRIgine (LAMICTAL) 100 MG tablet Take 150 mg by mouth 2 (two) times daily.     lamoTRIgine (LAMICTAL) 200 MG tablet SMARTSIG:1 Tablet(s) By Mouth Morning-Night     meclizine (ANTIVERT) 25 MG tablet Take 1 tablet (25 mg total) by mouth 3 (three) times daily as needed for dizziness. 30 tablet 0   omeprazole (PRILOSEC) 20 MG capsule Take 40 mg by mouth 2 (two) times daily.     ondansetron (ZOFRAN-ODT) 4 MG disintegrating tablet Take 4 mg by  mouth 3 (three) times daily.     prazosin (MINIPRESS) 2 MG capsule TAKE 1 CAPSULE(2 MG) BY MOUTH TWICE DAILY 180 capsule 1   sertraline (ZOLOFT) 100 MG tablet TAKE 2 TABLETS(200 MG) BY MOUTH DAILY 60 tablet 0   No current facility-administered medications for this visit.    Allergies:   Oseltamivir    ROS:  Please see the history of present illness.   Otherwise, review of systems are positive for ***.   All other systems are reviewed and negative.    PHYSICAL EXAM: VS:  There were no vitals taken for this visit. , BMI There is no height or weight on file to calculate BMI. GENERAL:  Well appearing NECK:  No jugular venous distention, waveform within normal limits, carotid upstroke brisk and symmetric, no bruits, no thyromegaly LUNGS:  Clear to auscultation bilaterally CHEST:  Unremarkable HEART:  PMI not displaced or sustained,S1 and S2 within normal limits, no S3, no S4, no clicks, no rubs, *** murmurs ABD:  Flat, positive bowel sounds normal in frequency in pitch, no bruits, no rebound, no guarding, no midline pulsatile mass, no hepatomegaly, no splenomegaly EXT:  2 plus pulses throughout, no edema, no cyanosis no clubbing    ***GENERAL:  Well appearing HEENT:  Pupils equal round and reactive, fundi not visualized, oral mucosa unremarkable NECK:  No jugular venous distention, waveform within normal limits, carotid upstroke brisk and symmetric, no bruits, no thyromegaly LYMPHATICS:  No cervical, inguinal adenopathy LUNGS:  Clear to auscultation bilaterally BACK:  No CVA tenderness CHEST:  Unremarkable HEART:  PMI not displaced or sustained,S1 and S2 within normal limits, no S3, no S4, no clicks, no rubs, no murmurs ABD:  Flat, positive bowel sounds normal in frequency in pitch, no bruits, no rebound, no guarding, no midline pulsatile mass, no hepatomegaly, no splenomegaly EXT:  2 plus pulses throughout, no edema, no cyanosis no clubbing SKIN:  No rashes no nodules NEURO:  Cranial  nerves II through XII grossly intact, motor grossly intact throughout PSYCH:  Cognitively intact, oriented to person place and time    EKG:  EKG is not ***ordered today. The ekg ordered 09/21/2020 demonstrates sinus rhythm, rate ***, right axis deviation, RSR prime V1 and V2, no acute ST-T wave changes.   Recent Labs: 07/28/2020: TSH 3.48 01/21/2021: ALT 14; BUN 8; Creatinine, Ser 1.06; Potassium 3.7; Sodium 140 02/06/2021: Hemoglobin 14.9; Platelets 239    Lipid Panel    Component Value Date/Time   CHOL 212 (H) 07/28/2020 1534   TRIG 85 07/28/2020 1534   HDL 33 (L) 07/28/2020 1534   CHOLHDL 6.4 (H) 07/28/2020 1534   LDLCALC 160 (H) 07/28/2020  1534      Wt Readings from Last 3 Encounters:  04/13/21 125 lb (56.7 kg)  04/10/21 125 lb 12.8 oz (57.1 kg)  03/01/21 126 lb (57.2 kg)      Other studies Reviewed: Additional studies/ records that were reviewed today include: *** Review of the above records demonstrates:  Please see elsewhere in the note.     ASSESSMENT AND PLAN:  SYNCOPE:  ***  He has a somewhat vague history of syncope and today he was not orthostatic in the office.  I am going to start with a 2-week event monitor as he describes some heart pounding.  Have a low threshold for an echocardiogram.  Probably most important is he is going to try to get his cardiology records from his previous cardiologist so we do not duplicate too much of a work-up.  HTN: His blood pressure was  *** elevated today 130s over 80s.  He has been on the meds as listed and I think this is reasonable to continue.  I will be looking at the above records to see if there is any secondary etiologies.   Current medicines are reviewed at length with the patient today.  The patient does not have concerns regarding medicines.  The following changes have been made:  ***  Labs/ tests ordered today include: ***  No orders of the defined types were placed in this encounter.     Disposition:   FU  with ***   Signed, Minus Breeding, MD  07/05/2021 8:49 PM    [facility]

## 2021-07-06 ENCOUNTER — Ambulatory Visit: Payer: Medicaid Other | Admitting: Cardiology

## 2021-07-06 DIAGNOSIS — I10 Essential (primary) hypertension: Secondary | ICD-10-CM

## 2021-07-06 DIAGNOSIS — R55 Syncope and collapse: Secondary | ICD-10-CM

## 2021-07-17 ENCOUNTER — Other Ambulatory Visit: Payer: Self-pay | Admitting: Nurse Practitioner

## 2021-07-17 DIAGNOSIS — F4310 Post-traumatic stress disorder, unspecified: Secondary | ICD-10-CM

## 2021-07-17 NOTE — Telephone Encounter (Signed)
Please send 30 days and be sure she is scheduled for an appt, make her aware of what you are doing.

## 2021-07-20 DIAGNOSIS — D649 Anemia, unspecified: Secondary | ICD-10-CM | POA: Diagnosis not present

## 2021-07-20 DIAGNOSIS — K50119 Crohn's disease of large intestine with unspecified complications: Secondary | ICD-10-CM | POA: Diagnosis not present

## 2021-07-26 ENCOUNTER — Telehealth: Payer: Self-pay

## 2021-07-26 DIAGNOSIS — Z419 Encounter for procedure for purposes other than remedying health state, unspecified: Secondary | ICD-10-CM | POA: Diagnosis not present

## 2021-07-26 NOTE — Telephone Encounter (Signed)
Attempted to call pt. Pt needs refill on Zoloft, per provider it is okay to send in a 30 day supply. Left pt vm, give the office a call back so she can be made aware and have apt sch.  ?

## 2021-08-16 DIAGNOSIS — F4310 Post-traumatic stress disorder, unspecified: Secondary | ICD-10-CM | POA: Diagnosis not present

## 2021-08-16 DIAGNOSIS — G40209 Localization-related (focal) (partial) symptomatic epilepsy and epileptic syndromes with complex partial seizures, not intractable, without status epilepticus: Secondary | ICD-10-CM | POA: Diagnosis not present

## 2021-08-26 DIAGNOSIS — Z419 Encounter for procedure for purposes other than remedying health state, unspecified: Secondary | ICD-10-CM | POA: Diagnosis not present

## 2021-09-25 DIAGNOSIS — Z419 Encounter for procedure for purposes other than remedying health state, unspecified: Secondary | ICD-10-CM | POA: Diagnosis not present

## 2021-09-29 ENCOUNTER — Other Ambulatory Visit (HOSPITAL_COMMUNITY): Payer: Self-pay | Admitting: Physician Assistant

## 2021-09-29 DIAGNOSIS — E559 Vitamin D deficiency, unspecified: Secondary | ICD-10-CM

## 2021-10-20 ENCOUNTER — Other Ambulatory Visit (HOSPITAL_COMMUNITY): Payer: Self-pay | Admitting: Physician Assistant

## 2021-10-20 DIAGNOSIS — E559 Vitamin D deficiency, unspecified: Secondary | ICD-10-CM

## 2021-11-20 ENCOUNTER — Other Ambulatory Visit (HOSPITAL_COMMUNITY): Payer: Self-pay | Admitting: *Deleted

## 2021-11-20 DIAGNOSIS — E559 Vitamin D deficiency, unspecified: Secondary | ICD-10-CM

## 2021-11-20 MED ORDER — VITAMIN D (ERGOCALCIFEROL) 1.25 MG (50000 UNIT) PO CAPS: ORAL_CAPSULE | ORAL | 1 refills | Status: DC

## 2021-11-25 DIAGNOSIS — Z419 Encounter for procedure for purposes other than remedying health state, unspecified: Secondary | ICD-10-CM | POA: Diagnosis not present

## 2022-07-28 ENCOUNTER — Other Ambulatory Visit: Payer: Self-pay | Admitting: Physician Assistant

## 2022-07-28 DIAGNOSIS — E559 Vitamin D deficiency, unspecified: Secondary | ICD-10-CM

## 2023-08-28 ENCOUNTER — Other Ambulatory Visit: Payer: Self-pay | Admitting: Medical Genetics

## 2023-08-29 ENCOUNTER — Encounter (HOSPITAL_COMMUNITY): Payer: Self-pay | Admitting: Hematology

## 2023-08-29 ENCOUNTER — Other Ambulatory Visit (HOSPITAL_COMMUNITY)
Admission: RE | Admit: 2023-08-29 | Discharge: 2023-08-29 | Disposition: A | Discharge status: Home / Self Care | Payer: Self-pay | Source: Ambulatory Visit | Attending: Medical Genetics | Admitting: Medical Genetics

## 2023-09-04 ENCOUNTER — Other Ambulatory Visit (HOSPITAL_COMMUNITY): Payer: Self-pay

## 2023-09-04 ENCOUNTER — Encounter (INDEPENDENT_AMBULATORY_CARE_PROVIDER_SITE_OTHER): Payer: Self-pay

## 2023-09-10 LAB — GENECONNECT MOLECULAR SCREEN: Genetic Analysis Overall Interpretation: NEGATIVE

## 2023-09-19 ENCOUNTER — Telehealth: Payer: Self-pay | Admitting: Orthopaedic Surgery

## 2023-09-19 NOTE — Telephone Encounter (Signed)
 Please copy 04/13/2021 xray to CD. Please let me know when ready. Thank you!

## 2023-09-30 NOTE — Telephone Encounter (Signed)
 Gregg Gibson aware needed imaging was done by Dr. Murrel Arnt at [facility] office and we are unable to see those images to make the CD. Will need to get from [place] office.

## 2023-10-17 NOTE — Telephone Encounter (Signed)
 Hey! Can you please copy the 04/13/2021 xry to CD and bring back to [facility]?

## 2023-10-18 NOTE — Telephone Encounter (Signed)
 Mailed to patient

## 2024-01-09 ENCOUNTER — Other Ambulatory Visit: Payer: Self-pay | Admitting: *Deleted

## 2024-03-30 ENCOUNTER — Encounter: Payer: Self-pay | Admitting: Radiology
